# Patient Record
Sex: Female | Born: 1985 | Hispanic: Yes | Marital: Married | Smoking: Current some day smoker
Health system: Southern US, Community
[De-identification: ages and names within clinical notes are randomized; demographics above are authoritative.]

## PROBLEM LIST (undated history)

## (undated) DIAGNOSIS — J45909 Unspecified asthma, uncomplicated: Secondary | ICD-10-CM

## (undated) DIAGNOSIS — D649 Anemia, unspecified: Secondary | ICD-10-CM

## (undated) HISTORY — DX: Anemia, unspecified: D64.9

## (undated) HISTORY — PX: NO PAST SURGERIES: SHX2092

---
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
## 2007-08-13 ENCOUNTER — Observation Stay: Payer: Self-pay

## 2007-08-22 ENCOUNTER — Inpatient Hospital Stay: Payer: Self-pay | Admitting: Obstetrics and Gynecology

## 2010-08-28 ENCOUNTER — Ambulatory Visit: Payer: Self-pay | Admitting: Family Medicine

## 2011-01-21 ENCOUNTER — Ambulatory Visit: Payer: Self-pay | Admitting: Family Medicine

## 2011-02-08 ENCOUNTER — Observation Stay: Payer: Self-pay | Admitting: Obstetrics and Gynecology

## 2011-02-08 ENCOUNTER — Inpatient Hospital Stay: Payer: Self-pay | Admitting: Obstetrics and Gynecology

## 2011-02-08 LAB — CBC WITH DIFFERENTIAL/PLATELET
Basophil #: 0 10*3/uL (ref 0.0–0.1)
Eosinophil #: 0.1 10*3/uL (ref 0.0–0.7)
HGB: 13.1 g/dL (ref 12.0–16.0)
Lymphocyte #: 1.4 10*3/uL (ref 1.0–3.6)
Lymphocyte %: 17.4 %
MCH: 35.7 pg — ABNORMAL HIGH (ref 26.0–34.0)
MCV: 103 fL — ABNORMAL HIGH (ref 80–100)
Monocyte #: 0.7 10*3/uL (ref 0.0–0.7)
Monocyte %: 9 %
Neutrophil #: 5.9 10*3/uL (ref 1.4–6.5)
Neutrophil %: 72.5 %
Platelet: 129 10*3/uL — ABNORMAL LOW (ref 150–440)
RBC: 3.66 10*6/uL — ABNORMAL LOW (ref 3.80–5.20)
WBC: 8.1 10*3/uL (ref 3.6–11.0)

## 2011-02-10 LAB — HEMOGLOBIN: HGB: 12.2 g/dL (ref 12.0–16.0)

## 2011-08-28 ENCOUNTER — Emergency Department: Payer: Self-pay | Admitting: Emergency Medicine

## 2011-08-28 LAB — HCG, QUANTITATIVE, PREGNANCY: Beta Hcg, Quant.: <1 m[IU]/mL — ABNORMAL LOW

## 2014-07-05 ENCOUNTER — Encounter: Payer: Self-pay | Admitting: Emergency Medicine

## 2014-07-05 ENCOUNTER — Emergency Department
Admission: EM | Admit: 2014-07-05 | Discharge: 2014-07-06 | Disposition: A | Discharge status: Home / Self Care | Payer: Self-pay | Attending: Emergency Medicine | Admitting: Emergency Medicine

## 2014-07-05 DIAGNOSIS — N12 Tubulo-interstitial nephritis, not specified as acute or chronic: Secondary | ICD-10-CM | POA: Insufficient documentation

## 2014-07-05 DIAGNOSIS — Z3202 Encounter for pregnancy test, result negative: Secondary | ICD-10-CM | POA: Insufficient documentation

## 2014-07-05 LAB — COMPREHENSIVE METABOLIC PANEL
ALBUMIN: 3.6 g/dL (ref 3.5–5.0)
ALK PHOS: 53 U/L (ref 38–126)
ALT: 15 U/L (ref 14–54)
ANION GAP: 9 (ref 5–15)
AST: 17 U/L (ref 15–41)
BUN: 9 mg/dL (ref 6–20)
CHLORIDE: 106 mmol/L (ref 101–111)
CO2: 22 mmol/L (ref 22–32)
Calcium: 9 mg/dL (ref 8.9–10.3)
Creatinine, Ser: 0.9 mg/dL (ref 0.44–1.00)
GFR calc Af Amer: >60 mL/min (ref >60)
GFR calc non Af Amer: >60 mL/min (ref >60)
Glucose, Bld: 106 mg/dL — ABNORMAL HIGH (ref 65–99)
Potassium: 3.7 mmol/L (ref 3.5–5.1)
SODIUM: 137 mmol/L (ref 135–145)
Total Bilirubin: 0.7 mg/dL (ref 0.3–1.2)
Total Protein: 7.1 g/dL (ref 6.5–8.1)

## 2014-07-05 MED ORDER — ACETAMINOPHEN 325 MG PO TABS
650.0000 mg | ORAL_TABLET | Freq: Once | ORAL | Status: AC
  Administered 2014-07-05: 650 mg via ORAL

## 2014-07-05 MED ORDER — SODIUM CHLORIDE 0.9 % IV BOLUS (SEPSIS)
1000.0000 mL | Freq: Once | INTRAVENOUS | Status: AC
Start: 2014-07-05 — End: 2014-07-06
  Administered 2014-07-05: 1000 mL via INTRAVENOUS

## 2014-07-05 MED ORDER — ACETAMINOPHEN 325 MG PO TABS
ORAL_TABLET | ORAL | Status: AC
  Filled 2014-07-05: qty 2

## 2014-07-05 NOTE — ED Notes (Signed)
Pt presents to the ER from home with complaints of urinary frequency, burning sensation and hematuria since Friday. Pt reports lower back pain this afternoon, severe pain.

## 2014-07-06 LAB — PREGNANCY, URINE: PREG TEST UR: NEGATIVE

## 2014-07-06 LAB — CBC WITH DIFFERENTIAL/PLATELET
Basophils Absolute: 0 10*3/uL (ref 0–0.1)
Basophils Relative: 0 %
EOS ABS: 0 10*3/uL (ref 0–0.7)
EOS PCT: 1 %
HCT: 34.1 % — ABNORMAL LOW (ref 35.0–47.0)
HEMOGLOBIN: 11.8 g/dL — AB (ref 12.0–16.0)
Lymphocytes Relative: 5 %
Lymphs Abs: 0.4 10*3/uL — ABNORMAL LOW (ref 1.0–3.6)
MCH: 32.8 pg (ref 26.0–34.0)
MCHC: 34.5 g/dL (ref 32.0–36.0)
MCV: 95.2 fL (ref 80.0–100.0)
Monocytes Absolute: 0.2 10*3/uL (ref 0.2–0.9)
Monocytes Relative: 2 %
NEUTROS ABS: 8.5 10*3/uL — AB (ref 1.4–6.5)
NEUTROS PCT: 92 %
Platelets: 137 10*3/uL — ABNORMAL LOW (ref 150–440)
RBC: 3.58 MIL/uL — AB (ref 3.80–5.20)
RDW: 12.8 % (ref 11.5–14.5)
WBC: 9.2 10*3/uL (ref 3.6–11.0)

## 2014-07-06 LAB — URINALYSIS COMPLETE WITH MICROSCOPIC (ARMC ONLY)
Bacteria, UA: NONE SEEN
Bilirubin Urine: NEGATIVE
Glucose, UA: NEGATIVE mg/dL
KETONES UR: NEGATIVE mg/dL
NITRITE: NEGATIVE
PH: 6 (ref 5.0–8.0)
Protein, ur: 100 mg/dL — AB
SPECIFIC GRAVITY, URINE: 1.011 (ref 1.005–1.030)

## 2014-07-06 MED ORDER — KETOROLAC TROMETHAMINE 30 MG/ML IJ SOLN
30.0000 mg | Freq: Once | INTRAMUSCULAR | Status: AC
  Administered 2014-07-06: 30 mg via INTRAVENOUS

## 2014-07-06 MED ORDER — CEFTRIAXONE SODIUM IN DEXTROSE 20 MG/ML IV SOLN
1.0000 g | Freq: Once | INTRAVENOUS | Status: AC
  Administered 2014-07-06: 1 g via INTRAVENOUS

## 2014-07-06 MED ORDER — SULFAMETHOXAZOLE-TRIMETHOPRIM 800-160 MG PO TABS: 1.0000 | ORAL_TABLET | Freq: Two times a day (BID) | ORAL | Status: DC

## 2014-07-06 MED ORDER — SODIUM CHLORIDE 0.9 % IV BOLUS (SEPSIS)
1000.0000 mL | Freq: Once | INTRAVENOUS | Status: AC
  Administered 2014-07-06: 1000 mL via INTRAVENOUS

## 2014-07-06 MED ORDER — KETOROLAC TROMETHAMINE 30 MG/ML IJ SOLN
INTRAMUSCULAR | Status: AC
  Filled 2014-07-06: qty 8

## 2014-07-06 MED ORDER — CEFTRIAXONE SODIUM IN DEXTROSE 20 MG/ML IV SOLN
INTRAVENOUS | Status: AC
  Administered 2014-07-06: 1 g via INTRAVENOUS
  Filled 2014-07-06: qty 50

## 2014-07-06 NOTE — ED Provider Notes (Signed)
[facility] Emergency Department Provider Note  ____________________________________________  Time seen: Approximately 12:15 AM  I have reviewed the triage vital signs and the nursing notes.   HISTORY  Chief Complaint Dysuria; Hematuria; Urinary Frequency; and Back Pain    HPI Leah Villarreal is a 29 y.o. female who comes in with some abdominal pain and back pain. She reports that the symptoms started 5 days ago. She reports that she developed some pain with urination as well as pain in her abdomen and back. She reports that the pain is all across her abdomen as well as in her back on both sides with the right greater than left. She reports that she developed some fevers today with nausea but no vomiting. She also noticed some blood in her urine as well. The patient was concerned so she came in for further evaluation. She reports that her pain is 8 out of 10 in intensity in her back and he feels that was stabbing. The patient did not take anything at home for pain.   History reviewed. No pertinent past medical history.  There are no active problems to display for this patient.   History reviewed. No pertinent past surgical history.  Current Outpatient Rx  Name  Route  Sig  Dispense  Refill  . sulfamethoxazole-trimethoprim (BACTRIM DS,SEPTRA DS) 800-160 MG per tablet   Oral   Take 1 tablet by mouth 2 (two) times daily.   14 tablet   0     Allergies Review of patient's allergies indicates no known allergies.  No family history on file.  Social History History  Substance Use Topics  . Smoking status: Never Smoker   . Smokeless tobacco: Not on file  . Alcohol Use: No    Review of Systems Constitutional: Fever Eyes: Alert vision ENT: No sore throat. Cardiovascular: Denies chest pain. Respiratory: Denies shortness of breath. Gastrointestinal: Abdominal pain, nausea, bilateral flank pain Genitourinary: Dysuria and  hematuria Musculoskeletal: Bilateral flank pain Skin: Negative for rash. Neurological: Negative for headaches,   10-point ROS otherwise negative.  ____________________________________________   PHYSICAL EXAM:  VITAL SIGNS: ED Triage Vitals  Enc Vitals Group     BP 07/05/14 2255 112/53 mmHg     Pulse Rate 07/05/14 2255 127     Resp 07/05/14 2255 24     Temp 07/05/14 2255 102.1 F (38.9 C)     Temp Source 07/05/14 2255 Oral     SpO2 07/05/14 2255 100 %     Weight 07/05/14 2255 145 lb (65.772 kg)     Height 07/05/14 2255 5\' 2"  (1.575 m)     Head Cir --      Peak Flow --      Pain Score 07/05/14 2256 10     Pain Loc --      Pain Edu? --      Excl. in GC? --     Constitutional: Alert and oriented. Well appearing and in moderate distress. Eyes: Conjunctivae are normal. PERRL. EOMI. Head: Atraumatic. Nose: No congestion/rhinnorhea. Mouth/Throat: Mucous membranes are moist.  Oropharynx non-erythematous. Cardiovascular: Tachycardia with regular rhythm. Grossly normal heart sounds.  Good peripheral circulation. Respiratory: Normal respiratory effort.  No retractions. Lungs CTAB. Gastrointestinal: Diffusely tender to palpation, right sided CVA tenderness, positive bowel sounds, soft Genitourinary: Deferred Musculoskeletal: No lower extremity tenderness nor edema.  Neurologic:  Normal speech and language. No gross focal neurologic deficits are appreciated.  Skin:  Skin is warm, dry and intact. No rash noted. Psychiatric:  Mood and affect are normal. .  ____________________________________________   LABS (all labs ordered are listed, but only abnormal results are displayed)  Labs Reviewed  CBC WITH DIFFERENTIAL/PLATELET - Abnormal; Notable for the following:    RBC 3.58 (*)    Hemoglobin 11.8 (*)    HCT 34.1 (*)    Platelets 137 (*)    Neutro Abs 8.5 (*)    Lymphs Abs 0.4 (*)    All other components within normal limits  COMPREHENSIVE METABOLIC PANEL - Abnormal; Notable  for the following:    Glucose, Bld 106 (*)    All other components within normal limits  URINALYSIS COMPLETEWITH MICROSCOPIC ([facility] ONLY) - Abnormal; Notable for the following:    Color, Urine YELLOW (*)    APPearance HAZY (*)    Hgb urine dipstick 3+ (*)    Protein, ur 100 (*)    Leukocytes, UA 2+ (*)    Squamous Epithelial / LPF 0-5 (*)    All other components within normal limits  CULTURE, BLOOD (ROUTINE X 2)  CULTURE, BLOOD (ROUTINE X 2)  PREGNANCY, URINE   ____________________________________________  EKG  None ____________________________________________  RADIOLOGY  None ____________________________________________   PROCEDURES  Procedure(s) performed: None  Critical Care performed: No  ____________________________________________   INITIAL IMPRESSION / ASSESSMENT AND PLAN / ED COURSE  Pertinent labs & imaging results that were available during my care of the patient were reviewed by me and considered in my medical decision making (see chart for details).  This is a 29 year old female who comes in today with some abdominal pain and flank pain with a temperature with a concern for urinary tract infection versus pyelonephritis. The patient did receive some normal saline as well as Tylenol for her temperature of 102.1. I will give the patient some antibiotics and some more fluid in an attempt to improve her tachycardia.  ----------------------------------------- 3:12 AM on 07/06/2014 -----------------------------------------  Gross second liter of normal saline and a dose of ceftriaxone the patient's tachycardia improved. The patient's fever also did improve. I gave her dose of Toradol for the pain and she does feel improved. I will discharge the patient home to follow-up with her primary care physician. The patient should return should she develop any vomiting or any worsening of her symptoms. ____________________________________________   FINAL CLINICAL  IMPRESSION(S) / ED DIAGNOSES  Final diagnoses:  Pyelonephritis      Rebecka Apley, MD 07/06/14 [phone]

## 2014-07-06 NOTE — Discharge Instructions (Signed)
Pielonefritis - Adultos  °(Pyelonephritis, Adult) ° La pielonefritis es una infección del riñón. Hay dos tipos principales de pielonefritis:  °· Una infección que se inicia rápidamente sin síntomas previos (pielonefritis aguda). °· Infecciones que persisten por un largo período (pielonefritis crónica). °CAUSAS  °Hay dos causas principales:  °· Pasaje de bacterias desde la vejiga al riñón. Este problema aparece especialmente en mujeres embarazadas. La orina en la vejiga puede infectarse por diferentes causas, por ejemplo: °¨ Inflamación de la próstata (prostatitis). °¨ Durante las relaciones sexuales en las mujeres. °¨ Infección en la vejiga (cistitis). °· Pasaje de bacterias desde la sangre hacia el riñón. °Las enfermedades que aumentan el riesgo son:  °· Diabetes. °· Cálculos renales o en la vesícula. °· Cáncer. °· Un catéter colocado en la vejiga. °· Otras anormalidades del riñón o de la uretra. °SÍNTOMAS  °· Dolor abdominal °· Dolor en la zona del costado o flanco. °· Fiebre. °· Escalofríos. °· Malestar estomacal. °· Sangre en la orina (orina oscura). °· Necesidad frecuente de orinar °· Necesidad intensa o persistente de orinar. °· Sensación de ardor o pinchazos al orinar. °DIAGNÓSTICO  °El médico diagnosticará una infección en su riñón basándose en los síntomas. También tomará una muestra de orina.  °TRATAMIENTO  °Generalmente el tratamiento depende de la gravedad de la infección.  °· Si la infección es leve y se diagnostica a tiempo, el médico lo tratará con antibióticos por vía oral y lo dejará irse a su casa. °· Si la infección es más grave, la bacteria podría haber ingresado al torrente sanguíneo. Esto requerirá antibióticos por vía intravenosa y la permanencia en el hospital. Los síntomas pueden incluir: °¨ Fiebre alta. °¨ Dolor intenso en un costado del cuerpo. °¨ Escalofríos °· Aún después de haber permanecido en el hospital, el médico podrá indicarle antibióticos por vía oral durante cierto período de  tiempo. °· Podrá prescribirle otros tratamientos según la causa de la infección. °INSTRUCCIONES PARA EL CUIDADO EN EL HOGAR  °· Tome los antibióticos como se le indicó. Tómelos todos, aunque se sienta mejor. °· Concurra para realizar un control y asegurarse de que la infección ha desaparecido. °· Debe ingerir gran cantidad de líquido para mantener la orina de tono claro o color amarillo pálido. °· Tome medicamentos para la vejiga si siente urgencia para orinar o lo hace con mucha frecuencia. °SOLICITE ATENCIÓN MÉDICA DE INMEDIATO SI:  °· Tiene fiebre o síntomas persistentes durante más de 2 ó 3 días. °· Tiene fiebre y los síntomas empeoran. °· No puede tomar los antibióticos ni ingerir líquidos. °· Comienza a sentir escalofríos. °· Siente debilidad extrema o se desmaya. °· No mejora después de 2 días de tratamiento. °ASEGÚRESE DE QUE:  °· Comprende estas instrucciones. °· Controlará su enfermedad. °· Solicitará ayuda de inmediato si no mejora o empeora. °Document Released: 10/31/2004 Document Revised: 07/23/2011 °ExitCare® Patient Information ©2015 [employer]. This information is not intended to replace advice given to you by your health care provider. Make sure you discuss any questions you have with your health care provider. ° °

## 2014-07-08 LAB — CULTURE, BLOOD (ROUTINE X 2)

## 2014-07-11 ENCOUNTER — Telehealth: Payer: Self-pay | Admitting: Emergency Medicine

## 2014-07-11 NOTE — ED Notes (Signed)
Called pt at request of dr Fanny Bienquale to ask Leah Villarreal to return to the ED for re evaluation due to positive blood culture. [name]  ([facility] interpretter) interpretted.  I asked pateitn to return and explained positive blood culture.   Leah Villarreal says she feels fine now. She does not have pcp.  [facility]  She said she would think about it.  i told her to definitely return to the er if she gets feeling worse or fever or any worsening condition and she agrees.

## 2014-07-11 NOTE — ED Provider Notes (Signed)
I was notified by pharmacy, patient had a positive blood culture for Escherichia coli resistant to Bactrim.  Based on the patient's symptoms of pyelonephritis with elevated temperature and heart rate, my primary concern is the patient may be septic and was discharged on antibiotic which is Escherichia coli is resistant to. Our infectious disease expert does recommend Bactrim, but appears in this case that this particular bacteria may be resistant.  I have given report to Sebastian AcheGreg Moyer, charge nurse, and he will assure that the patient is followed up with for contact and to have him return to the ER for repeat evaluation and exam. At this point, I'm hesitant to prescribe him any additional antibiotic prior to him returning for reevaluation to assure that he is clinically stable for any sort of outpatient treatment.  Charge nurse will follow-through to see that patient is contacted and returns the ER.  Sharyn CreamerMark Lawyer Washabaugh, MD 07/11/14 [phone]

## 2014-07-21 ENCOUNTER — Encounter: Payer: Self-pay | Admitting: Emergency Medicine

## 2014-07-21 ENCOUNTER — Emergency Department
Admission: EM | Admit: 2014-07-21 | Discharge: 2014-07-21 | Disposition: A | Discharge status: Home / Self Care | Payer: Self-pay | Attending: Emergency Medicine | Admitting: Emergency Medicine

## 2014-07-21 DIAGNOSIS — Z3202 Encounter for pregnancy test, result negative: Secondary | ICD-10-CM | POA: Insufficient documentation

## 2014-07-21 DIAGNOSIS — N390 Urinary tract infection, site not specified: Secondary | ICD-10-CM | POA: Insufficient documentation

## 2014-07-21 LAB — URINALYSIS COMPLETE WITH MICROSCOPIC (ARMC ONLY)
BACTERIA UA: NONE SEEN
Bilirubin Urine: NEGATIVE
GLUCOSE, UA: NEGATIVE mg/dL
Hgb urine dipstick: NEGATIVE
KETONES UR: NEGATIVE mg/dL
NITRITE: NEGATIVE
Protein, ur: NEGATIVE mg/dL
Specific Gravity, Urine: 1.015 (ref 1.005–1.030)
pH: 5 (ref 5.0–8.0)

## 2014-07-21 LAB — POCT PREGNANCY, URINE: PREG TEST UR: NEGATIVE

## 2014-07-21 MED ORDER — CEPHALEXIN 500 MG PO CAPS: 500.0000 mg | ORAL_CAPSULE | Freq: Four times a day (QID) | ORAL | Status: AC

## 2014-07-21 NOTE — ED Provider Notes (Signed)
CSN: [id number]     Arrival date & time 07/21/14  1249 History   First MD Initiated Contact with Patient 07/21/14 1458     Chief Complaint  Patient presents with  . Hematuria     (Consider location/radiation/quality/duration/timing/severity/associated sxs/prior Treatment) HPI Comments: 29 year old female presents today complaining of dysuria for the past few days. Pt has not had any fevers, chills, back or abdominal pain. No nausea or vomiting. She is concerned because she was evaluated here on 07/06/14 and then called back on 07/11/14 and informed she had a positive blood culture. She was advised to return to the ER or see her primary doctor. [facility]-and they told her they couldn't help her. She has felt fine since being discharged aside from dysuria that re-developed 2 days ago.   Patient is a 29 y.o. female presenting with dysuria. The history is provided by the patient. A language interpreter was used.  Dysuria Pain quality:  Burning Pain severity:  Mild Onset quality:  Gradual Duration:  2 days Timing:  Constant Progression:  Unchanged Chronicity:  Recurrent Recent urinary tract infections: yes   Relieved by:  None tried Worsened by:  Nothing tried Ineffective treatments:  None tried Urinary symptoms: frequent urination   Associated symptoms: no abdominal pain, no fever, no flank pain, no nausea and no vomiting   Risk factors: hx of pyelonephritis and recurrent urinary tract infections   Risk factors: not pregnant and not sexually active     History reviewed. No pertinent past medical history. History reviewed. No pertinent past surgical history. History reviewed. No pertinent family history. History  Substance Use Topics  . Smoking status: Never Smoker   . Smokeless tobacco: Not on file  . Alcohol Use: No   OB History    No data available     Review of Systems  Constitutional: Negative for fever and chills.  Gastrointestinal: Negative  for nausea, vomiting and abdominal pain.  Genitourinary: Positive for dysuria. Negative for flank pain.  All other systems reviewed and are negative.     Allergies  Review of patient's allergies indicates no known allergies.  Home Medications   Prior to Admission medications   Medication Sig Start Date End Date Taking? Authorizing Provider  cephALEXin (KEFLEX) 500 MG capsule Take 1 capsule (500 mg total) by mouth 4 (four) times daily. 07/21/14 07/31/14  Luvenia Redden, PA-C  sulfamethoxazole-trimethoprim (BACTRIM DS,SEPTRA DS) 800-160 MG per tablet Take 1 tablet by mouth 2 (two) times daily. 07/06/14   Rebecka Apley, MD   BP 115/69 mmHg  Pulse 73  Temp(Src) 98.1 F (36.7 C) (Oral)  Resp 16  Ht  (1.676 m)  Wt 150 lb (68.04 kg)  BMI 24.22 kg/m2  SpO2 100%  LMP  (LMP Unknown) Physical Exam  Constitutional: She is oriented to person, place, and time. Vital signs are normal. She appears well-developed and well-nourished.  Non-toxic appearance. She does not have a sickly appearance. She does not appear ill.  HENT:  Head: Normocephalic and atraumatic.  Neck: Normal range of motion. Neck supple.  Cardiovascular: Normal rate, regular rhythm and intact distal pulses.  Exam reveals no gallop and no friction rub.   No murmur heard. Pulmonary/Chest: Effort normal and breath sounds normal.  Abdominal: Soft. Bowel sounds are normal. She exhibits no distension. There is no tenderness. There is no rebound.  No CVA tenderness  Musculoskeletal: Normal range of motion.  Neurological: She is alert and  oriented to person, place, and time.  Skin: Skin is warm and dry.  Psychiatric: She has a normal mood and affect. Her behavior is normal. Judgment and thought content normal.  Nursing note and vitals reviewed.   ED Course  Procedures (including critical care time) Labs Review Labs Reviewed  URINALYSIS COMPLETEWITH MICROSCOPIC ([facility] ONLY) - Abnormal; Notable for the following:    Color,  Urine YELLOW (*)    APPearance CLEAR (*)    Leukocytes, UA 1+ (*)    Squamous Epithelial / LPF 6-30 (*)    All other components within normal limits  POCT PREGNANCY, URINE  negative   Imaging Review No results found.   EKG Interpretation None      Labs as above were reviewed. Pt only has 1+ leukocytes with multiple squamous cells. However, she had similar findings 2 weeks ago when she had pyelonephritis.   MDM  I will write pt for keflex, her prior urine culture was sensitive to keflex. Written for 10 days course. Explained that since she was feeling better her blood had likely cleared the infection. Return to ER for new or worsening symptoms  Final diagnoses:  UTI (lower urinary tract infection)        Wilber Oliphant V, PA-C 07/21/14 1952  Jene Every, MD 07/22/14 [phone]

## 2014-07-21 NOTE — ED Notes (Signed)
Pt reports lower back pain and burning with urination

## 2014-07-21 NOTE — ED Notes (Signed)
Pt reports seen 2 weeks ago, d/c home and then called and told to come back. Pt reports unsure why. Pt spanish speaking, interpreter in room with physician. Pt reports tried to f/u with her PMD but they couldn't help. Pt denies sx's currently other than dysuria.

## 2016-07-09 ENCOUNTER — Other Ambulatory Visit: Payer: Self-pay | Admitting: Advanced Practice Midwife

## 2016-07-09 DIAGNOSIS — Z3481 Encounter for supervision of other normal pregnancy, first trimester: Secondary | ICD-10-CM

## 2016-07-09 LAB — HM PAP SMEAR: HM Pap smear: NEGATIVE

## 2016-07-09 LAB — OB RESULTS CONSOLE HIV ANTIBODY (ROUTINE TESTING): HIV: NONREACTIVE

## 2016-07-10 LAB — OB RESULTS CONSOLE ABO/RH: RH Type: POSITIVE

## 2016-07-10 LAB — OB RESULTS CONSOLE ANTIBODY SCREEN: ANTIBODY SCREEN: NEGATIVE

## 2016-07-18 ENCOUNTER — Ambulatory Visit
Admission: RE | Admit: 2016-07-18 | Discharge: 2016-07-18 | Disposition: A | Discharge status: Home / Self Care | Payer: Self-pay | Source: Ambulatory Visit | Attending: Advanced Practice Midwife | Admitting: Advanced Practice Midwife

## 2016-07-18 DIAGNOSIS — Z3481 Encounter for supervision of other normal pregnancy, first trimester: Secondary | ICD-10-CM | POA: Insufficient documentation

## 2016-07-18 DIAGNOSIS — Z3A11 11 weeks gestation of pregnancy: Secondary | ICD-10-CM | POA: Insufficient documentation

## 2016-08-06 ENCOUNTER — Other Ambulatory Visit: Payer: Self-pay | Admitting: Advanced Practice Midwife

## 2016-08-06 DIAGNOSIS — Z3482 Encounter for supervision of other normal pregnancy, second trimester: Secondary | ICD-10-CM

## 2016-09-03 ENCOUNTER — Ambulatory Visit
Admission: RE | Admit: 2016-09-03 | Discharge: 2016-09-03 | Disposition: A | Discharge status: Home / Self Care | Payer: Self-pay | Source: Ambulatory Visit | Attending: Advanced Practice Midwife | Admitting: Advanced Practice Midwife

## 2016-09-03 DIAGNOSIS — Z3482 Encounter for supervision of other normal pregnancy, second trimester: Secondary | ICD-10-CM | POA: Insufficient documentation

## 2016-10-11 DIAGNOSIS — D649 Anemia, unspecified: Secondary | ICD-10-CM | POA: Insufficient documentation

## 2016-10-11 DIAGNOSIS — O99013 Anemia complicating pregnancy, third trimester: Secondary | ICD-10-CM | POA: Insufficient documentation

## 2016-10-11 NOTE — Progress Notes (Signed)
[facility]  [phone] [phone]  ID: Fleet Contras OB: [birth year]  MR#: [id number]  AOZ#:[id number]  Patient Care Team: Nonda Lou, MD as PCP - General (Family Medicine)  CHIEF COMPLAINT: Anemia affecting pregnancy in the third trimester.  INTERVAL HISTORY: Patient is a 31 year old female in her third trimester who was noted to have a declining hemoglobin and platelet count on routine blood work. She currently feels well and is asymptomatic. She has no neurologic complaints. She does not complain of any weakness or fatigue. She has a good appetite and is gaining weight appropriately. She has no chest pain or shortness of breath. She denies any nausea, vomiting, constipation, or diarrhea. She has no urinary complaints. Patient feels at her baseline and offers no specific complaints today.  REVIEW OF SYSTEMS:   Review of Systems  Constitutional: Negative.  Negative for fever, malaise/fatigue and weight loss.  Respiratory: Negative.  Negative for cough and shortness of breath.   Cardiovascular: Negative.  Negative for chest pain and leg swelling.  Gastrointestinal: Negative.  Negative for abdominal pain, blood in stool and melena.  Genitourinary: Negative.   Musculoskeletal: Negative.   Skin: Negative.  Negative for rash.  Neurological: Negative.  Negative for sensory change and weakness.  Psychiatric/Behavioral: Negative.  The patient is not nervous/anxious.     As per HPI. Otherwise, a complete review of systems is negative.  PAST MEDICAL HISTORY: No past medical history on file.  PAST SURGICAL HISTORY: No past surgical history on file.  FAMILY HISTORY: Reviewed and unchanged. No reported history of malignancy or chronic disease.   ADVANCED DIRECTIVES (Y/N):  N  HEALTH MAINTENANCE: Social History  Substance Use Topics  . Smoking status: Never Smoker  . Smokeless tobacco: Not on file  . Alcohol use No      Colonoscopy:  PAP:  Bone density:  Lipid panel:  No Known Allergies  Current Outpatient Prescriptions  Medication Sig Dispense Refill  . Prenatal Vit-Fe Fumarate-FA (PRENATAL VITAMIN PO) Take by mouth.     No current facility-administered medications for this visit.     OBJECTIVE: Vitals:   10/14/16 1535  BP: 103/64  Pulse: 81  Resp: 20  Temp: (!) 96.8 F (36 C)     Body mass index is 22.98 kg/m.    ECOG FS:0 - Asymptomatic  General: Well-developed, well-nourished, no acute distress. Eyes: Pink conjunctiva, anicteric sclera. HEENT: Normocephalic, moist mucous membranes, clear oropharnyx. Lungs: Clear to auscultation bilaterally. Heart: Regular rate and rhythm. No rubs, murmurs, or gallops. Abdomen: Appears appropriate for gestational age. Musculoskeletal: No edema, cyanosis, or clubbing. Neuro: Alert, answering all questions appropriately. Cranial nerves grossly intact. Skin: No rashes or petechiae noted. Psych: Normal affect.   LAB RESULTS:  Lab Results  Component Value Date   NA 137 07/05/2014   K 3.7 07/05/2014   CL 106 07/05/2014   CO2 22 07/05/2014   GLUCOSE 106 (H) 07/05/2014   BUN 9 07/05/2014   CREATININE 0.90 07/05/2014   CALCIUM 9.0 07/05/2014   PROT 7.1 07/05/2014   ALBUMIN 3.6 07/05/2014   AST 17 07/05/2014   ALT 15 07/05/2014   ALKPHOS 53 07/05/2014   BILITOT 0.7 07/05/2014   GFRNONAA >60 07/05/2014   GFRAA >60 07/05/2014    Lab Results  Component Value Date   WBC 7.3 10/14/2016   NEUTROABS 8.5 (H) 07/05/2014   HGB 12.4 10/14/2016   HCT 34.2 (L) 10/14/2016   MCV 99.4 10/14/2016   PLT 138 (L) 10/14/2016  STUDIES: No results found.  ASSESSMENT: Anemia affecting pregnancy in the third trimester.  PLAN:    1. Anemia affecting pregnancy in the third trimester: Patient's hemoglobin and iron stores are currently within normal limits. Previously B-12 and folate levels were reported within normal limits. She has no evidence of  hemolysis. No intervention is needed at this time. Patient will return to clinic in early December several weeks prior to her due date for repeat laboratory work and further evaluation. 2. Thrombocytopenia: Mild, no intervention needed. Monitor. Follow-up as above. 3. Pregnancy: Patient's estimated due date is February 04, 2017.  Patient expressed understanding and was in agreement with this plan. She also understands that She can call clinic at any time with any questions, concerns, or complaints.    Jeralyn Ruthsimothy J Finnegan, MD   10/18/2016 9:49 AM

## 2016-10-14 ENCOUNTER — Inpatient Hospital Stay: Payer: Self-pay

## 2016-10-14 ENCOUNTER — Encounter (INDEPENDENT_AMBULATORY_CARE_PROVIDER_SITE_OTHER): Payer: Self-pay

## 2016-10-14 ENCOUNTER — Inpatient Hospital Stay: Payer: Self-pay | Attending: Oncology | Admitting: Oncology

## 2016-10-14 DIAGNOSIS — D696 Thrombocytopenia, unspecified: Secondary | ICD-10-CM | POA: Insufficient documentation

## 2016-10-14 DIAGNOSIS — O99013 Anemia complicating pregnancy, third trimester: Secondary | ICD-10-CM

## 2016-10-14 DIAGNOSIS — Z331 Pregnant state, incidental: Secondary | ICD-10-CM | POA: Insufficient documentation

## 2016-10-14 DIAGNOSIS — D649 Anemia, unspecified: Secondary | ICD-10-CM | POA: Insufficient documentation

## 2016-10-14 LAB — CBC
HCT: 34.2 % — ABNORMAL LOW (ref 35.0–47.0)
Hemoglobin: 12.4 g/dL (ref 12.0–16.0)
MCH: 35.9 pg — ABNORMAL HIGH (ref 26.0–34.0)
MCHC: 36.1 g/dL — ABNORMAL HIGH (ref 32.0–36.0)
MCV: 99.4 fL (ref 80.0–100.0)
PLATELETS: 138 10*3/uL — AB (ref 150–440)
RBC: 3.44 MIL/uL — ABNORMAL LOW (ref 3.80–5.20)
RDW: 13.7 % (ref 11.5–14.5)
WBC: 7.3 10*3/uL (ref 3.6–11.0)

## 2016-10-14 LAB — IRON AND TIBC
Iron: 128 ug/dL (ref 28–170)
Saturation Ratios: 39 % — ABNORMAL HIGH (ref 10.4–31.8)
TIBC: 326 ug/dL (ref 250–450)
UIBC: 198 ug/dL

## 2016-10-14 LAB — DAT, POLYSPECIFIC AHG (ARMC ONLY): Polyspecific AHG test: NEGATIVE

## 2016-10-14 LAB — LACTATE DEHYDROGENASE: LDH: 101 U/L (ref 98–192)

## 2016-10-14 LAB — FERRITIN: FERRITIN: 18 ng/mL (ref 11–307)

## 2016-10-14 NOTE — Progress Notes (Signed)
Patient here today for initial evaluation regarding anemia in pregnancy. Patient denies any concerns today.

## 2016-10-15 LAB — HAPTOGLOBIN: Haptoglobin: 65 mg/dL (ref 34–200)

## 2016-11-13 LAB — HM HIV SCREENING LAB: HM HIV Screening: NEGATIVE

## 2016-11-13 LAB — OB RESULTS CONSOLE HEPATITIS B SURFACE ANTIGEN: HEP B S AG: NEGATIVE

## 2016-11-14 LAB — OB RESULTS CONSOLE RPR: RPR: NONREACTIVE

## 2017-01-01 ENCOUNTER — Other Ambulatory Visit: Payer: Self-pay | Admitting: Oncology

## 2017-01-05 NOTE — Progress Notes (Signed)
[facility]  Telephone:(336) [phone] Fax:(336) [phone]  ID: Leah Villarreal OB: 1985/10/30  MR#: [id number]  AOZ#:[id number]#:[id number]  Patient Care Team: Nonda LouShapely-Quinn, Todd [place], MD as PCP - General (Family Medicine)  CHIEF COMPLAINT: Anemia affecting pregnancy in the third trimester.  INTERVAL HISTORY: Patient returns to clinic today for repeat laboratory work and further evaluation.  She is highly anxious, but otherwise feels well. She has no neurologic complaints. She does not complain of any weakness or fatigue. She has a good appetite and is gaining weight appropriately. She has no chest pain or shortness of breath. She denies any nausea, vomiting, constipation, or diarrhea. She has no urinary complaints. Patient offers no specific complaints today.  REVIEW OF SYSTEMS:   Review of Systems  Constitutional: Negative.  Negative for fever, malaise/fatigue and weight loss.  Respiratory: Negative.  Negative for cough and shortness of breath.   Cardiovascular: Negative.  Negative for chest pain and leg swelling.  Gastrointestinal: Negative.  Negative for abdominal pain, blood in stool and melena.  Genitourinary: Negative.   Musculoskeletal: Negative.   Skin: Negative.  Negative for rash.  Neurological: Negative.  Negative for sensory change and weakness.  Psychiatric/Behavioral: Negative.  The patient is not nervous/anxious.     As per HPI. Otherwise, a complete review of systems is negative.  PAST MEDICAL HISTORY: Past Medical History:  Diagnosis Date  . Anemia     PAST SURGICAL HISTORY: History reviewed. No pertinent surgical history.  FAMILY HISTORY: Reviewed and unchanged. No reported history of malignancy or chronic disease.   ADVANCED DIRECTIVES (Y/N):  N  HEALTH MAINTENANCE: Social History   Tobacco Use  . Smoking status: Never Smoker  . Smokeless tobacco: Never Used  Substance Use Topics  . Alcohol use: No  . Drug use: No      Colonoscopy:  PAP:  Bone density:  Lipid panel:  No Known Allergies  Current Outpatient Medications  Medication Sig Dispense Refill  . Prenatal Vit-Fe Fumarate-FA (PRENATAL VITAMIN PO) Take by mouth.     No current facility-administered medications for this visit.     OBJECTIVE: Vitals:   01/06/17 1406  BP: 131/78  Pulse: 93     Body mass index is 26.28 kg/m.    ECOG FS:0 - Asymptomatic  General: Well-developed, well-nourished, no acute distress. Eyes: Pink conjunctiva, anicteric sclera. Lungs: Clear to auscultation bilaterally. Heart: Regular rate and rhythm. No rubs, murmurs, or gallops. Abdomen: Appears appropriate for gestational age. Musculoskeletal: No edema, cyanosis, or clubbing. Neuro: Alert, answering all questions appropriately. Cranial nerves grossly intact. Skin: No rashes or petechiae noted. Psych: Normal affect.   LAB RESULTS:  Lab Results  Component Value Date   NA 137 07/05/2014   K 3.7 07/05/2014   CL 106 07/05/2014   CO2 22 07/05/2014   GLUCOSE 106 (H) 07/05/2014   BUN 9 07/05/2014   CREATININE 0.90 07/05/2014   CALCIUM 9.0 07/05/2014   PROT 7.1 07/05/2014   ALBUMIN 3.6 07/05/2014   AST 17 07/05/2014   ALT 15 07/05/2014   ALKPHOS 53 07/05/2014   BILITOT 0.7 07/05/2014   GFRNONAA >60 07/05/2014   GFRAA >60 07/05/2014    Lab Results  Component Value Date   WBC 7.1 01/06/2017   NEUTROABS 5.3 01/06/2017   HGB 13.1 01/06/2017   HCT 37.6 01/06/2017   MCV 102.9 (H) 01/06/2017   PLT 128 (L) 01/06/2017     STUDIES: No results found.  ASSESSMENT: Anemia affecting pregnancy in the third trimester.  PLAN:  1. Anemia affecting pregnancy in the third trimester: Patient's hemoglobin and iron stores continue to be within normal limits. Previously B-12 and folate levels were reported within normal limits. She has no evidence of hemolysis. No intervention is needed at this time.  No further follow-up is necessary.  Please refer  patient back if there are any questions or concerns.  2. Thrombocytopenia: Mild, no intervention needed. Monitor. No follow-up as above. 3. Pregnancy: Patient's estimated due date is February 04, 2017.  Patient expressed understanding and was in agreement with this plan. She also understands that She can call clinic at any time with any questions, concerns, or complaints.    Jeralyn Ruthsimothy J Staphanie Harbison, MD   01/06/2017 3:50 PM

## 2017-01-06 ENCOUNTER — Encounter: Payer: Self-pay | Admitting: Oncology

## 2017-01-06 ENCOUNTER — Inpatient Hospital Stay (HOSPITAL_BASED_OUTPATIENT_CLINIC_OR_DEPARTMENT_OTHER): Payer: Self-pay | Admitting: Oncology

## 2017-01-06 ENCOUNTER — Inpatient Hospital Stay: Payer: Self-pay | Attending: Oncology

## 2017-01-06 ENCOUNTER — Inpatient Hospital Stay: Payer: Self-pay

## 2017-01-06 ENCOUNTER — Other Ambulatory Visit: Payer: Self-pay

## 2017-01-06 VITALS — BP 131/78 | HR 93 | Wt 162.8 lb

## 2017-01-06 DIAGNOSIS — F419 Anxiety disorder, unspecified: Secondary | ICD-10-CM

## 2017-01-06 DIAGNOSIS — O99013 Anemia complicating pregnancy, third trimester: Secondary | ICD-10-CM

## 2017-01-06 DIAGNOSIS — D696 Thrombocytopenia, unspecified: Secondary | ICD-10-CM | POA: Insufficient documentation

## 2017-01-06 LAB — CBC WITH DIFFERENTIAL/PLATELET
Basophils Absolute: 0 10*3/uL (ref 0–0.1)
Basophils Relative: 0 %
EOS ABS: 0.1 10*3/uL (ref 0–0.7)
EOS PCT: 1 %
HCT: 37.6 % (ref 35.0–47.0)
HEMOGLOBIN: 13.1 g/dL (ref 12.0–16.0)
LYMPHS PCT: 16 %
Lymphs Abs: 1.1 10*3/uL (ref 1.0–3.6)
MCH: 35.9 pg — ABNORMAL HIGH (ref 26.0–34.0)
MCHC: 34.9 g/dL (ref 32.0–36.0)
MCV: 102.9 fL — ABNORMAL HIGH (ref 80.0–100.0)
Monocytes Absolute: 0.6 10*3/uL (ref 0.2–0.9)
Monocytes Relative: 8 %
NEUTROS PCT: 75 %
Neutro Abs: 5.3 10*3/uL (ref 1.4–6.5)
PLATELETS: 128 10*3/uL — AB (ref 150–440)
RBC: 3.66 MIL/uL — AB (ref 3.80–5.20)
RDW: 13.7 % (ref 11.5–14.5)
WBC: 7.1 10*3/uL (ref 3.6–11.0)

## 2017-01-06 LAB — IRON AND TIBC
IRON: 89 ug/dL (ref 28–170)
Saturation Ratios: 22 % (ref 10.4–31.8)
TIBC: 398 ug/dL (ref 250–450)
UIBC: 309 ug/dL

## 2017-01-06 LAB — FERRITIN: Ferritin: 13 ng/mL (ref 11–307)

## 2017-01-10 LAB — OB RESULTS CONSOLE GC/CHLAMYDIA
Chlamydia: NEGATIVE
GC PROBE AMP, GENITAL: NEGATIVE

## 2017-01-10 LAB — OB RESULTS CONSOLE GBS: GBS: NEGATIVE

## 2017-02-03 ENCOUNTER — Other Ambulatory Visit: Payer: Self-pay | Admitting: Obstetrics and Gynecology

## 2017-02-07 ENCOUNTER — Other Ambulatory Visit: Payer: Self-pay | Admitting: Obstetrics and Gynecology

## 2017-02-09 ENCOUNTER — Other Ambulatory Visit: Payer: Self-pay | Admitting: Certified Nurse Midwife

## 2017-02-10 ENCOUNTER — Inpatient Hospital Stay: Payer: Medicaid Other | Admitting: Anesthesiology

## 2017-02-10 ENCOUNTER — Other Ambulatory Visit: Payer: Self-pay

## 2017-02-10 ENCOUNTER — Inpatient Hospital Stay
Admission: EM | Admit: 2017-02-10 | Discharge: 2017-02-12 | DRG: 787 | Disposition: A | Discharge status: Home / Self Care | Payer: Medicaid Other | Attending: Obstetrics and Gynecology | Admitting: Obstetrics and Gynecology

## 2017-02-10 ENCOUNTER — Inpatient Hospital Stay: Payer: Medicaid Other

## 2017-02-10 ENCOUNTER — Encounter
Admission: EM | Disposition: A | Discharge status: Home / Self Care | Payer: Self-pay | Source: Home / Self Care | Attending: Obstetrics and Gynecology

## 2017-02-10 DIAGNOSIS — D6959 Other secondary thrombocytopenia: Secondary | ICD-10-CM | POA: Diagnosis present

## 2017-02-10 DIAGNOSIS — Z3A40 40 weeks gestation of pregnancy: Secondary | ICD-10-CM | POA: Diagnosis not present

## 2017-02-10 DIAGNOSIS — O329XX0 Maternal care for malpresentation of fetus, unspecified, not applicable or unspecified: Secondary | ICD-10-CM

## 2017-02-10 DIAGNOSIS — O9081 Anemia of the puerperium: Secondary | ICD-10-CM | POA: Diagnosis not present

## 2017-02-10 DIAGNOSIS — D62 Acute posthemorrhagic anemia: Secondary | ICD-10-CM | POA: Diagnosis not present

## 2017-02-10 DIAGNOSIS — O321XX0 Maternal care for breech presentation, not applicable or unspecified: Secondary | ICD-10-CM | POA: Diagnosis present

## 2017-02-10 DIAGNOSIS — O9912 Other diseases of the blood and blood-forming organs and certain disorders involving the immune mechanism complicating childbirth: Secondary | ICD-10-CM | POA: Diagnosis present

## 2017-02-10 DIAGNOSIS — O480 Post-term pregnancy: Secondary | ICD-10-CM | POA: Diagnosis present

## 2017-02-10 DIAGNOSIS — Z3A41 41 weeks gestation of pregnancy: Secondary | ICD-10-CM

## 2017-02-10 HISTORY — DX: Unspecified asthma, uncomplicated: J45.909

## 2017-02-10 LAB — URINALYSIS, ROUTINE W REFLEX MICROSCOPIC
Bilirubin Urine: NEGATIVE
Glucose, UA: NEGATIVE mg/dL
Ketones, ur: NEGATIVE mg/dL
NITRITE: NEGATIVE
PH: 6 (ref 5.0–8.0)
Protein, ur: NEGATIVE mg/dL
SPECIFIC GRAVITY, URINE: 1.005 (ref 1.005–1.030)

## 2017-02-10 LAB — CBC
HCT: 37.8 % (ref 35.0–47.0)
Hemoglobin: 13.5 g/dL (ref 12.0–16.0)
MCH: 36.6 pg — ABNORMAL HIGH (ref 26.0–34.0)
MCHC: 35.7 g/dL (ref 32.0–36.0)
MCV: 102.7 fL — ABNORMAL HIGH (ref 80.0–100.0)
PLATELETS: 123 10*3/uL — AB (ref 150–440)
RBC: 3.69 MIL/uL — AB (ref 3.80–5.20)
RDW: 13.1 % (ref 11.5–14.5)
WBC: 7.7 10*3/uL (ref 3.6–11.0)

## 2017-02-10 LAB — TYPE AND SCREEN
ABO/RH(D): O POS
Antibody Screen: NEGATIVE

## 2017-02-10 SURGERY — Surgical Case
Anesthesia: Spinal | Wound class: Clean Contaminated

## 2017-02-10 MED ORDER — OXYCODONE-ACETAMINOPHEN 5-325 MG PO TABS
1.0000 | ORAL_TABLET | ORAL | Status: DC | PRN
  Administered 2017-02-11 (×2): 1 via ORAL
  Filled 2017-02-10 (×2): qty 1

## 2017-02-10 MED ORDER — SODIUM CHLORIDE 0.9 % IV SOLN
INTRAVENOUS | Status: DC | PRN
  Administered 2017-02-10: 60 mL

## 2017-02-10 MED ORDER — TERBUTALINE SULFATE 1 MG/ML IJ SOLN: 0.2500 mg | Freq: Once | INTRAMUSCULAR | Status: DC | PRN

## 2017-02-10 MED ORDER — BISACODYL 10 MG RE SUPP
10.0000 mg | Freq: Every day | RECTAL | Status: DC | PRN
  Filled 2017-02-10: qty 1

## 2017-02-10 MED ORDER — ONDANSETRON HCL 4 MG/2ML IJ SOLN: 4.0000 mg | Freq: Four times a day (QID) | INTRAMUSCULAR | Status: DC | PRN

## 2017-02-10 MED ORDER — KETOROLAC TROMETHAMINE 30 MG/ML IJ SOLN: 30.0000 mg | Freq: Four times a day (QID) | INTRAMUSCULAR | Status: AC | PRN

## 2017-02-10 MED ORDER — SODIUM CHLORIDE 0.9 % IJ SOLN: INTRAMUSCULAR | Status: DC | PRN

## 2017-02-10 MED ORDER — MORPHINE SULFATE (PF) 0.5 MG/ML IJ SOLN
INTRAMUSCULAR | Status: DC | PRN
Start: 2017-02-10 — End: 2017-02-10
  Administered 2017-02-10: .2 mg via EPIDURAL

## 2017-02-10 MED ORDER — ACETAMINOPHEN 325 MG PO TABS: 650.0000 mg | ORAL_TABLET | ORAL | Status: DC | PRN

## 2017-02-10 MED ORDER — ONDANSETRON HCL 4 MG/2ML IJ SOLN
INTRAMUSCULAR | Status: AC
  Filled 2017-02-10: qty 2

## 2017-02-10 MED ORDER — OXYTOCIN 40 UNITS IN LACTATED RINGERS INFUSION - SIMPLE MED
2.5000 [IU]/h | INTRAVENOUS | Status: DC
  Administered 2017-02-11: 2.5 [IU]/h via INTRAVENOUS
  Filled 2017-02-10 (×2): qty 1000

## 2017-02-10 MED ORDER — WITCH HAZEL-GLYCERIN EX PADS: 1.0000 "application " | MEDICATED_PAD | CUTANEOUS | Status: DC | PRN

## 2017-02-10 MED ORDER — TETANUS-DIPHTH-ACELL PERTUSSIS 5-2.5-18.5 LF-MCG/0.5 IM SUSP: 0.5000 mL | Freq: Once | INTRAMUSCULAR | Status: DC

## 2017-02-10 MED ORDER — AMMONIA AROMATIC IN INHA
RESPIRATORY_TRACT | Status: AC
  Filled 2017-02-10: qty 10

## 2017-02-10 MED ORDER — LIDOCAINE HCL (PF) 1 % IJ SOLN
30.0000 mL | INTRAMUSCULAR | Status: DC | PRN
  Filled 2017-02-10: qty 30

## 2017-02-10 MED ORDER — BUPIVACAINE LIPOSOME 1.3 % IJ SUSP
20.0000 mL | Freq: Once | INTRAMUSCULAR | Status: DC
  Filled 2017-02-10: qty 20

## 2017-02-10 MED ORDER — ONDANSETRON HCL 4 MG/2ML IJ SOLN
INTRAMUSCULAR | Status: DC | PRN
  Administered 2017-02-10: 4 mg via INTRAVENOUS

## 2017-02-10 MED ORDER — SIMETHICONE 80 MG PO CHEW: 80.0000 mg | CHEWABLE_TABLET | ORAL | Status: DC | PRN

## 2017-02-10 MED ORDER — BUPIVACAINE HCL (PF) 0.5 % IJ SOLN
INTRAMUSCULAR | Status: AC
  Filled 2017-02-10: qty 30

## 2017-02-10 MED ORDER — DIBUCAINE 1 % RE OINT: 1.0000 "application " | TOPICAL_OINTMENT | RECTAL | Status: DC | PRN

## 2017-02-10 MED ORDER — FENTANYL CITRATE (PF) 100 MCG/2ML IJ SOLN
INTRAMUSCULAR | Status: AC
  Filled 2017-02-10: qty 2

## 2017-02-10 MED ORDER — BUTORPHANOL TARTRATE 1 MG/ML IJ SOLN: 1.0000 mg | INTRAMUSCULAR | Status: DC | PRN

## 2017-02-10 MED ORDER — BUPIVACAINE HCL (PF) 0.5 % IJ SOLN
INTRAMUSCULAR | Status: DC | PRN
  Administered 2017-02-10: 30 mL

## 2017-02-10 MED ORDER — BUPIVACAINE IN DEXTROSE 0.75-8.25 % IT SOLN
INTRATHECAL | Status: DC | PRN
  Administered 2017-02-10: 2 mL via INTRATHECAL

## 2017-02-10 MED ORDER — ONDANSETRON HCL 4 MG/2ML IJ SOLN: 4.0000 mg | Freq: Three times a day (TID) | INTRAMUSCULAR | Status: DC | PRN

## 2017-02-10 MED ORDER — DIPHENHYDRAMINE HCL 25 MG PO CAPS: 25.0000 mg | ORAL_CAPSULE | Freq: Four times a day (QID) | ORAL | Status: DC | PRN

## 2017-02-10 MED ORDER — IBUPROFEN 600 MG PO TABS
600.0000 mg | ORAL_TABLET | Freq: Four times a day (QID) | ORAL | Status: DC
  Administered 2017-02-10 – 2017-02-12 (×7): 600 mg via ORAL
  Filled 2017-02-10 (×7): qty 1

## 2017-02-10 MED ORDER — OXYTOCIN 10 UNIT/ML IJ SOLN
INTRAMUSCULAR | Status: AC
  Filled 2017-02-10: qty 2

## 2017-02-10 MED ORDER — FENTANYL CITRATE (PF) 100 MCG/2ML IJ SOLN
INTRAMUSCULAR | Status: DC | PRN
  Administered 2017-02-10: 15 ug via INTRAVENOUS

## 2017-02-10 MED ORDER — OXYCODONE-ACETAMINOPHEN 5-325 MG PO TABS: 2.0000 | ORAL_TABLET | ORAL | Status: DC | PRN

## 2017-02-10 MED ORDER — DIPHENHYDRAMINE HCL 25 MG PO CAPS: 25.0000 mg | ORAL_CAPSULE | ORAL | Status: DC | PRN

## 2017-02-10 MED ORDER — SIMETHICONE 80 MG PO CHEW
80.0000 mg | CHEWABLE_TABLET | ORAL | Status: DC
  Filled 2017-02-10: qty 1

## 2017-02-10 MED ORDER — LACTATED RINGERS IV SOLN
INTRAVENOUS | Status: DC
  Administered 2017-02-11: 11:00:00 via INTRAVENOUS

## 2017-02-10 MED ORDER — OXYTOCIN 40 UNITS IN LACTATED RINGERS INFUSION - SIMPLE MED
2.5000 [IU]/h | INTRAVENOUS | Status: DC
  Filled 2017-02-10: qty 1000

## 2017-02-10 MED ORDER — SODIUM CHLORIDE 0.9 % IJ SOLN
INTRAMUSCULAR | Status: AC
  Filled 2017-02-10: qty 50

## 2017-02-10 MED ORDER — NALBUPHINE HCL 10 MG/ML IJ SOLN: 5.0000 mg | INTRAMUSCULAR | Status: DC | PRN

## 2017-02-10 MED ORDER — MISOPROSTOL 200 MCG PO TABS
ORAL_TABLET | ORAL | Status: AC
  Filled 2017-02-10: qty 4

## 2017-02-10 MED ORDER — PHENYLEPHRINE HCL 10 MG/ML IJ SOLN
INTRAMUSCULAR | Status: AC
  Filled 2017-02-10: qty 1

## 2017-02-10 MED ORDER — COCONUT OIL OIL
1.0000 "application " | TOPICAL_OIL | Status: DC | PRN
  Filled 2017-02-10: qty 120

## 2017-02-10 MED ORDER — LACTATED RINGERS IV SOLN
500.0000 mL | INTRAVENOUS | Status: DC | PRN
  Administered 2017-02-10: 15:00:00 via INTRAVENOUS

## 2017-02-10 MED ORDER — ONDANSETRON HCL 4 MG/2ML IJ SOLN: 4.0000 mg | Freq: Once | INTRAMUSCULAR | Status: DC | PRN

## 2017-02-10 MED ORDER — PRENATAL MULTIVITAMIN CH
1.0000 | ORAL_TABLET | Freq: Every day | ORAL | Status: DC
  Administered 2017-02-11: 1 via ORAL
  Filled 2017-02-10: qty 1

## 2017-02-10 MED ORDER — MENTHOL 3 MG MT LOZG
1.0000 | LOZENGE | OROMUCOSAL | Status: DC | PRN
  Filled 2017-02-10: qty 9

## 2017-02-10 MED ORDER — SENNOSIDES-DOCUSATE SODIUM 8.6-50 MG PO TABS
2.0000 | ORAL_TABLET | ORAL | Status: DC
  Administered 2017-02-11 – 2017-02-12 (×2): 2 via ORAL
  Filled 2017-02-10 (×2): qty 2

## 2017-02-10 MED ORDER — SIMETHICONE 80 MG PO CHEW
80.0000 mg | CHEWABLE_TABLET | Freq: Three times a day (TID) | ORAL | Status: DC
  Administered 2017-02-11 – 2017-02-12 (×3): 80 mg via ORAL
  Filled 2017-02-10 (×4): qty 1

## 2017-02-10 MED ORDER — NALBUPHINE HCL 10 MG/ML IJ SOLN: 5.0000 mg | Freq: Once | INTRAMUSCULAR | Status: DC | PRN

## 2017-02-10 MED ORDER — LACTATED RINGERS IV SOLN
INTRAVENOUS | Status: DC
  Administered 2017-02-10: 06:00:00 via INTRAVENOUS

## 2017-02-10 MED ORDER — DIPHENHYDRAMINE HCL 50 MG/ML IJ SOLN: 12.5000 mg | INTRAMUSCULAR | Status: DC | PRN

## 2017-02-10 MED ORDER — CEFAZOLIN SODIUM-DEXTROSE 2-4 GM/100ML-% IV SOLN
2.0000 g | INTRAVENOUS | Status: AC
  Administered 2017-02-10: 2 g via INTRAVENOUS
  Filled 2017-02-10: qty 100

## 2017-02-10 MED ORDER — MORPHINE SULFATE (PF) 0.5 MG/ML IJ SOLN
INTRAMUSCULAR | Status: AC
  Filled 2017-02-10: qty 10

## 2017-02-10 MED ORDER — NALOXONE HCL 0.4 MG/ML IJ SOLN
1.0000 ug/kg/h | INTRAVENOUS | Status: DC | PRN
  Filled 2017-02-10: qty 5

## 2017-02-10 MED ORDER — OXYTOCIN BOLUS FROM INFUSION: 500.0000 mL | Freq: Once | INTRAVENOUS | Status: DC

## 2017-02-10 MED ORDER — OXYTOCIN 40 UNITS IN LACTATED RINGERS INFUSION - SIMPLE MED: 1.0000 m[IU]/min | INTRAVENOUS | Status: DC

## 2017-02-10 MED ORDER — OXYTOCIN 40 UNITS IN LACTATED RINGERS INFUSION - SIMPLE MED
INTRAVENOUS | Status: DC | PRN
  Administered 2017-02-10: 1000 mL via INTRAVENOUS

## 2017-02-10 MED ORDER — MEASLES, MUMPS & RUBELLA VAC ~~LOC~~ INJ
0.5000 mL | INJECTION | Freq: Once | SUBCUTANEOUS | Status: DC
  Filled 2017-02-10: qty 0.5

## 2017-02-10 MED ORDER — SOD CITRATE-CITRIC ACID 500-334 MG/5ML PO SOLN: 30.0000 mL | ORAL | Status: DC | PRN

## 2017-02-10 MED ORDER — FENTANYL CITRATE (PF) 100 MCG/2ML IJ SOLN: 25.0000 ug | INTRAMUSCULAR | Status: DC | PRN

## 2017-02-10 MED ORDER — FLEET ENEMA 7-19 GM/118ML RE ENEM: 1.0000 | ENEMA | Freq: Every day | RECTAL | Status: DC | PRN

## 2017-02-10 MED ORDER — SODIUM CHLORIDE 0.9% FLUSH: 3.0000 mL | INTRAVENOUS | Status: DC | PRN

## 2017-02-10 MED ORDER — NALOXONE HCL 0.4 MG/ML IJ SOLN: 0.4000 mg | INTRAMUSCULAR | Status: DC | PRN

## 2017-02-10 MED ORDER — SOD CITRATE-CITRIC ACID 500-334 MG/5ML PO SOLN
30.0000 mL | ORAL | Status: DC
  Filled 2017-02-10: qty 15

## 2017-02-10 SURGICAL SUPPLY — 30 items
BARRIER ADHS 3X4 INTERCEED (GAUZE/BANDAGES/DRESSINGS) ×3 IMPLANT
CANISTER SUCT 3000ML PPV (MISCELLANEOUS) ×3 IMPLANT
CHLORAPREP W/TINT 26ML (MISCELLANEOUS) ×3 IMPLANT
DERMABOND ADVANCED (GAUZE/BANDAGES/DRESSINGS) ×2
DERMABOND ADVANCED .7 DNX12 (GAUZE/BANDAGES/DRESSINGS) ×1 IMPLANT
DRSG OPSITE POSTOP 4X10 (GAUZE/BANDAGES/DRESSINGS) ×3 IMPLANT
DRSG TELFA 3X8 NADH (GAUZE/BANDAGES/DRESSINGS) IMPLANT
ELECT REM PT RETURN 9FT ADLT (ELECTROSURGICAL) ×3
ELECTRODE REM PT RTRN 9FT ADLT (ELECTROSURGICAL) ×1 IMPLANT
GAUZE SPONGE 4X4 12PLY STRL (GAUZE/BANDAGES/DRESSINGS) IMPLANT
GLOVE PROTEXIS LATEX SZ 7.5 (GLOVE) ×24 IMPLANT
GOWN STRL REUS W/ TWL LRG LVL3 (GOWN DISPOSABLE) ×3 IMPLANT
GOWN STRL REUS W/TWL LRG LVL3 (GOWN DISPOSABLE) ×6
NDL SAFETY ECLIPSE 18X1.5 (NEEDLE) ×1 IMPLANT
NEEDLE HYPO 18GX1.5 SHARP (NEEDLE) ×2
NEEDLE HYPO 22GX1.5 SAFETY (NEEDLE) ×3 IMPLANT
NS IRRIG 1000ML POUR BTL (IV SOLUTION) ×3 IMPLANT
PAD OB MATERNITY 4.3X12.25 (PERSONAL CARE ITEMS) ×3 IMPLANT
PAD PREP 24X41 OB/GYN DISP (PERSONAL CARE ITEMS) ×3 IMPLANT
SUT MNCRL 4-0 (SUTURE)
SUT MNCRL 4-0 27XMFL (SUTURE)
SUT PDS AB 1 TP1 96 (SUTURE) ×3 IMPLANT
SUT PLAIN 2 0 XLH (SUTURE) ×3 IMPLANT
SUT PLAIN GUT 2-0 30 C14 SG823 (SUTURE) ×3
SUT VIC AB 0 CT1 36 (SUTURE) ×9 IMPLANT
SUT VIC AB 3-0 SH 27 (SUTURE) ×2
SUT VIC AB 3-0 SH 27X BRD (SUTURE) ×1 IMPLANT
SUTURE MNCRL 4-0 27XMF (SUTURE) IMPLANT
SUTURE PLN GUT2-0 30 C14 SG823 (SUTURE) ×1 IMPLANT
SYR 30ML LL (SYRINGE) ×9 IMPLANT

## 2017-02-10 NOTE — Discharge Summary (Signed)
Obstetrical Discharge Summary  Patient Name: Leah Villarreal DOB: 1985-11-19 MRN: [id number]  Date of Admission: 02/10/2017 Date of Discharge: 02/12/2017  Primary OB:  ACHD  Gestational Age at Delivery: 5765w6d   Antepartum complications: macrocytosis without anemia, mild thombocytopenia Admitting Diagnosis: breech presentation at term Secondary Diagnosis: Patient Active Problem List   Diagnosis Date Noted  . Post term pregnancy at [redacted] weeks gestation 02/10/2017  . Anemia affecting pregnancy in third trimester 10/11/2016    Complications: None Intrapartum complications/course:  Normal primary cesarean delivery Date of Delivery: 02/11/16 Delivered By: Christeen DouglasBethany Beasley Delivery Type: primary cesarean section, low transverse incision Anesthesia: spinal Placenta: Spontaneous Laceration: none Episiotomy: none Newborn Data: Live born female "Onalee HuaDavid" Birth Weight: 9 lb 11.6 oz (4410 g) APGAR: 9, 9  Newborn Delivery   Birth date/time:  02/10/2017 15:06:00 Delivery type:  C-Section, Low Transverse C-section categorization:  Primary       Discharge Physical Exam:  BP 105/61 (BP Location: Left Arm)   Pulse 66   Temp 98.5 F (36.9 C) (Oral)   Resp 20   Ht 5\' 3"  (1.6 m)   Wt 170 lb (77.1 kg)   SpO2 94%   Breastfeeding? Unknown   BMI 30.11 kg/m   General: NAD CV: RRR Pulm: CTABL, nl effort ABD: s/nd/nt, fundus firm and below the umbilicus Lochia: moderate Incision: c/d/i  DVT Evaluation: LE non-ttp, no evidence of DVT on exam.  Hemoglobin  Date Value Ref Range Status  02/12/2017 10.0 (L) 12.0 - 16.0 g/dL Final   HGB  Date Value Ref Range Status  02/10/2011 12.2 12.0 - 16.0 g/dL Final   HCT  Date Value Ref Range Status  02/12/2017 28.2 (L) 35.0 - 47.0 % Final  02/08/2011 37.9 35.0 - 47.0 % Final    Post partum course: uncomplicated Postpartum Procedures: none Disposition: stable, discharge to home. Baby Feeding: breastmilk  Baby Disposition: home with  mom  Rh Immune globulin given: n/a Rubella vaccine given: n/a Tdap vaccine given in AP or PP setting: AP Flu vaccine given in AP or PP setting: AP  Contraception: mini-pill  Prenatal Labs: O+, RI, VI, HIV neg, RPR NR, HBsAg neg, 1h 92, GBS neg    Plan:  Leah Villarreal was discharged to home in good condition. [facility] with delivering provider in 2 weeks   Discharge Medications: Allergies as of 02/12/2017   No Known Allergies     Medication List    TAKE these medications   ibuprofen 600 MG tablet Commonly known as:  ADVIL,MOTRIN Take 1 tablet (600 mg total) by mouth every 6 (six) hours.   oxyCODONE-acetaminophen 5-325 MG tablet Commonly known as:  PERCOCET/ROXICET Take 1 tablet by mouth every 4 (four) hours as needed (pain scale 4-7).   PRENATAL VITAMIN PO Take by mouth.   senna-docusate 8.6-50 MG tablet Commonly known as:  Senokot-S Take 2 tablets by mouth daily. Start taking on:  02/13/2017       Follow-up Information    Christeen DouglasBeasley, Bethany, MD. Go on 02/25/2017.   Specialty:  Obstetrics and Gynecology Why:  For postop check  @ 1:30 pm Contact information: [address] [place] [address] [phone]           Signed: Heloise OchoaMcVey, Osmond Steckman A, CNM 02/12/17 10:44 AM

## 2017-02-10 NOTE — Progress Notes (Signed)
Pt to OR for C-Section

## 2017-02-10 NOTE — Anesthesia Procedure Notes (Signed)
Spinal  Patient location during procedure: OR Start time: 02/10/2017 2:40 PM End time: 02/10/2017 2:45 PM Staffing Anesthesiologist: Martha Clan, MD Resident/CRNA: Jonna Clark, CRNA Performed: resident/CRNA  Preanesthetic Checklist Completed: patient identified, site marked, surgical consent, pre-op evaluation, timeout performed, IV checked, risks and benefits discussed and monitors and equipment checked Spinal Block Patient position: sitting Prep: Betadine Patient monitoring: heart rate, continuous pulse ox, blood pressure and cardiac monitor Approach: midline Location: L4-5 Injection technique: single-shot Needle Needle type: Whitacre and Introducer  Needle gauge: 24 G Needle length: 9 cm Assessment Sensory level: T6 Additional Notes Negative paresthesia. Negative blood return. Positive free-flowing CSF. Expiration date of kit checked and confirmed. Patient tolerated procedure well, without complications.

## 2017-02-10 NOTE — Progress Notes (Signed)
Time should be 1345 no 145

## 2017-02-10 NOTE — Progress Notes (Signed)
31yo [id number] at 6690w6d presenting for iol and found to be in breech presentation.   The risks of cesarean section discussed with the patient included but were not limited to: bleeding which may require transfusion or reoperation; infection which may require antibiotics; injury to bowel, bladder, ureters or other surrounding organs; injury to the fetus; need for additional procedures including hysterectomy in the event of a life-threatening hemorrhage; placental abnormalities wth subsequent pregnancies, incisional problems, thromboembolic phenomenon and other postoperative/anesthesia complications. The patient concurred with the proposed plan, giving informed written consent for the procedure. Anesthesia and OR aware. Preoperative prophylactic antibiotics and SCDs ordered on call to the OR.  To OR when ready. Patient ID: Leah Villarreal, female   DOB: [birth year], 32 y.o.   MRN: [id number]

## 2017-02-10 NOTE — Progress Notes (Signed)
Patient arrived to labor and delivery for induction of labor. No complaints at this time.  EFM applied and assessing.

## 2017-02-10 NOTE — Progress Notes (Signed)
Labor Progress Note  Leah Villarreal is a 32 y.o. [phone] at 5957w6d by LMP admitted for IOL planned at 40+6, but fetus found to be breech.   Subjective: feeling well overall, some mild back pain. Denies feeling UCs, VB or LOF. Denies s/sx PRe-e.   Objective: BP 117/76 (BP Location: Right Arm)   Pulse 93   Temp 98.2 F (36.8 C) (Oral)   Resp 16   Ht 5\' 3"  (1.6 m)   Wt 170 lb (77.1 kg)   BMI 30.11 kg/m  Notable VS details: reviewed  Fetal Assessment: FHT:  FHR: 130 bpm, variability: moderate,  accelerations:  Present,  decelerations:  Absent Category/reactivity:  Category I UC:   irregular, every 5-10 minutes SVE:   Deferred;  Dilation: 3 Effacement (%): 50 Cervical Position: Middle Station: -3 Presentation: (Breech by bedside U/S) [facility]  Membrane status:intact  AFI- 5cm per formal US done this Am.   Labs: Lab Results  Component Value Date   WBC 7.7 02/10/2017   HGB 13.5 02/10/2017   HCT 37.8 02/10/2017   MCV 102.7 (H) 02/10/2017   PLT 123 (L) 02/10/2017    Assessment / Plan: 1. C/S planned for this afternoon by Dr Dalbert GarnetBeasley.  2. Gestational thrombocytopenia- monitor closely, no indications of pre-e or HELLP  Fetal Wellbeing:  Category I Pain Control:  plan for spinal I/D:  n/a, pre-op abx per Dr Dalbert Garnetbeasley.  Anticipated MOD:  primary LTCS for breech  McVey, REBECCA A, CNM 02/10/2017, 9:01 AM

## 2017-02-10 NOTE — H&P (Signed)
OB History & Physical   History of Present Illness:  Chief Complaint:   HPI:  Leah Villarreal is a 32 y.o. [phone] female at 10376w6d dated by 1866w2d ultrasound.  She presents to L&D for induction of labor for late term pregnancy. She reports good fetal movement, no vaginal bleeding, no leakage of fluid, and no contractions.   Pregnancy Issues: 1. Mild thrombocytopenia 2. Macrocytosis without anemia  Maternal Medical History:   Past Medical History:  Diagnosis Date  . Anemia   . Asthma    no recent attacks,  no meds    Past Surgical History:  Procedure Laterality Date  . NO PAST SURGERIES      No Known Allergies  Prior to Admission medications   Medication Sig Start Date End Date Taking? Authorizing Provider  Prenatal Vit-Fe Fumarate-FA (PRENATAL VITAMIN PO) Take by mouth.   Yes [provider]     [facility]  Social History: She  reports that  has never smoked. she has never used smokeless tobacco. She reports that she does not drink alcohol or use drugs.  Family History: family history is not on file.   Review of Systems: A full review of systems was performed and negative except as noted in the HPI.     Physical Exam:  Vital Signs: BP 117/84 (BP Location: Left Arm)   Pulse 80   Temp 97.9 F (36.6 C) (Oral)   Resp 18   Ht 5\' 3"  (1.6 m)   Wt 77.1 kg (170 lb)   BMI 30.11 kg/m   General:   alert, cooperative, appears stated age and no distress  Skin:  normal and no rash or abnormalities  Neurologic:    Alert & oriented x 3  Lungs:   clear to auscultation bilaterally  Heart:   regular rate and rhythm, S1, S2 normal, no murmur, click, rub or gallop  Abdomen:  soft, non-tender; bowel sounds normal; no masses,  no organomegaly  Pelvis:  Exam deferred.  FHT:  130 BPM  Presentations:  breech  Cervix:  by RN Graciela HusbandsKristen Cevallos' exam at [phone]   Dilation: 3cm   Effacement: 50%   Station:  -3   Consistency:  medium   Position: middle  Extremities: : non-tender, symmetric, no edema bilaterally.      EFW: 7 lbs  Results for orders placed or performed during the hospital encounter of 02/10/17 (from the past 24 hour(s))  CBC     Status: Abnormal   Collection Time: 02/10/17  5:38 AM  Result Value Ref Range   WBC 7.7 3.6 - 11.0 K/uL   RBC 3.69 (L) 3.80 - 5.20 MIL/uL   Hemoglobin 13.5 12.0 - 16.0 g/dL   HCT 29.537.8 62.135.0 - 30.847.0 %   MCV 102.7 (H) 80.0 - 100.0 fL   MCH 36.6 (H) 26.0 - 34.0 pg   MCHC 35.7 32.0 - 36.0 g/dL   RDW 65.713.1 84.611.5 - 96.214.5 %   Platelets 123 (L) 150 - 440 K/uL  Urinalysis, Routine w reflex microscopic     Status: Abnormal   Collection Time: 02/10/17  5:38 AM  Result Value Ref Range   Color, Urine YELLOW (A) YELLOW   APPearance HAZY (A) CLEAR   Specific Gravity, Urine 1.005 1.005 - 1.030   pH 6.0 5.0 - 8.0   Glucose, UA NEGATIVE NEGATIVE mg/dL   Hgb urine dipstick SMALL (A) NEGATIVE   Bilirubin Urine NEGATIVE NEGATIVE   Ketones, ur NEGATIVE NEGATIVE  mg/dL   Protein, ur NEGATIVE NEGATIVE mg/dL   Nitrite NEGATIVE NEGATIVE   Leukocytes, UA TRACE (A) NEGATIVE   RBC / HPF 0-5 0 - 5 RBC/hpf   WBC, UA 0-5 0 - 5 WBC/hpf   Bacteria, UA MANY (A) NONE SEEN   Squamous Epithelial / LPF 0-5 (A) NONE SEEN   Mucus PRESENT    Amorphous Crystal PRESENT     Pertinent Results:  Prenatal Labs: Blood type/Rh O+  Antibody screen neg  Rubella Immune  Varicella Immune  RPR NR  HBsAg Neg  HIV NR  GC neg  Chlamydia neg  1 hour GTT 92  3 hour GTT n/a  GBS neg   FHT: baseline 130bpm, moderate variability, 15x15 accels present, decels absent: category I TOCO: occasional contraction, pt reports not feeling anything SVE:  Dilation: 3 / Effacement (%): 50 / Station: -3    Breech by leopolds and bedside ultrasound  Assessment:  Amayah Staheli is a 32 y.o. G84P3003 female at [redacted]w[redacted]d with breech presentation.   Plan:  1. Admit to Labor & Delivery 2. CBC, T&S, Clrs,  IVF 3. GBS neg  4. Consents obtained. 5. Continuous EFM/toco 6. Formal ultrasound to assess placental position and fluid level. Discussed breech position and possibility of ECV with Dr. Christeen Douglas, who said that patient may be a candidate based on ultrasound findings.    Genia Del, CNM 02/10/2017 6:55 AM ----- Genia Del Certified Nurse Midwife [facility], Department of OB/GYN [facility]

## 2017-02-10 NOTE — Anesthesia Preprocedure Evaluation (Signed)
Anesthesia Evaluation  Patient identified by MRN, date of birth, ID band Patient awake    Reviewed: Allergy & Precautions, NPO status , Patient's Chart, lab work & pertinent test results, reviewed documented beta blocker date and time   Airway Mallampati: III  TM Distance: >3 FB     Dental  (+) Chipped   Pulmonary           Cardiovascular      Neuro/Psych    GI/Hepatic   Endo/Other    Renal/GU      Musculoskeletal   Abdominal   Peds  Hematology  (+) anemia ,   Anesthesia Other Findings   Reproductive/Obstetrics                             Anesthesia Physical Anesthesia Plan  ASA: III  Anesthesia Plan: Spinal   Post-op Pain Management:    Induction:   PONV Risk Score and Plan:   Airway Management Planned:   Additional Equipment:   Intra-op Plan:   Post-operative Plan:   Informed Consent: I have reviewed the patients History and Physical, chart, labs and discussed the procedure including the risks, benefits and alternatives for the proposed anesthesia with the patient or authorized representative who has indicated his/her understanding and acceptance.     Plan Discussed with: CRNA  Anesthesia Plan Comments:         Anesthesia Quick Evaluation

## 2017-02-10 NOTE — Op Note (Signed)
  Cesarean Section Procedure Note  Date of procedure: 02/10/2017   Pre-operative Diagnosis: Intrauterine pregnancy at 3720w6d; breech presentation; AFI of 5  Post-operative Diagnosis: same, delivered.  Procedure: Primary Low Transverse Cesarean Section through Pfannenstiel incision  Surgeon: Christeen DouglasBethany Teala Daffron   Assistant(s):  CST  Anesthesia: Spinal anesthesia  Estimated Blood Loss:  800         Total IV Fluids: 750ml  Urine Output: 175ml         Specimens: Cord blood for O positive         Complications:  None; patient tolerated the procedure well.         Disposition: PACU - hemodynamically stable.         Condition: stable  Findings:  A female infant "Onalee HuaDavid" in breech, frank presentation. Amniotic fluid - Clear  Birth weight 4410 g, 9#12oz Apgars of 9 and 9 at one and five minutes respectively.  Intact placenta with a three-vessel cord.  Grossly normal uterus, tubes and ovaries bilaterally. No intraabdominal adhesions were noted.  Indications: malpresentation: frank breech  Procedure Details  The patient was taken to Operating Room, identified as the correct patient and the procedure verified as C-Section Delivery. A formal Time Out was held with all team members present and in agreement.  After induction of spinal anesthesia, the patient was draped and prepped in the usual sterile manner. A Pfannenstiel skin incision was made and carried down through the subcutaneous tissue to the fascia. Fascial incision was made and extended transversely with the Mayo scissors. The fascia was separated from the underlying rectus tissue superiorly and inferiorly. The peritoneum was identified and entered bluntly. Peritoneal incision was extended longitudinally. The utero-vesical peritoneal reflection was incised transversely and a bladder flap was created digitally.   A low transverse hysterotomy was made. The fetus was delivered in a breech presentation atraumatically, using standard breech  manuvers. The umbilical cord was clamped x2 and cut and the infant was handed to the awaiting pediatricians. The placenta was removed intact and appeared normal, intact, and with a 3-vessel cord.   The uterus was exteriorized and cleared of all clot and debris. The hysterotomy was closed with running sutures of 0-Vicryl. A second imbricating layer was placed with the same suture. Excellent hemostasis was observed. The peritoneal cavity was cleared of all clots and debris. The uterus was returned to the abdomen.   The pelvis was irrigated and again, excellent hemostasis was noted. The fascia was then reapproximated with running sutures of 0 Vicryl.  The subcutaneous tissue was reapproximated with running sutures of 2-0 vicryl. The skin was reapproximated with a 4-0 Monocryl subcuticular stitch. 20ml of Exparel injected along fascia and skin lines.  Instrument, sponge, and needle counts were correct prior to the abdominal closure and at the conclusion of the case.   The patient tolerated the procedure well and was transferred to the recovery room in stable condition.   Christeen DouglasBethany Tiwan Schnitker, MD 02/10/2017

## 2017-02-10 NOTE — Anesthesia Post-op Follow-up Note (Signed)
Anesthesia QCDR form completed.        

## 2017-02-10 NOTE — Progress Notes (Signed)
Labor Progress Note  Leah Villarreal is a 32 y.o. [phone] at 6941w6d by ultrasound admitted for induction of labor due to late term pregnancy.  Subjective:  Patient feeling nervous about need for c-section.    Objective: BP 117/76 (BP Location: Right Arm)   Pulse 93   Temp 98.2 F (36.8 C) (Oral)   Resp 16   Ht 5\' 3"  (1.6 m)   Wt 77.1 kg (170 lb)   BMI 30.11 kg/m   Fetal Assessment: FHT:  FHR: 135 bpm, variability: moderate,  accelerations:  Present,  decelerations:  Present non-current mild lates Category/reactivity:  Category II UC:  q5-8, pt reports mild or not feeling at all SVE:  Deferred  Membrane status: intact Amniotic color: n/a  Labs: Lab Results  Component Value Date   WBC 7.7 02/10/2017   HGB 13.5 02/10/2017   HCT 37.8 02/10/2017   MCV 102.7 (H) 02/10/2017   PLT 123 (L) 02/10/2017    Assessment / Plan: Presented for IOL, fetal position found to be breech, c-section planned for this afternoon  Dr. Christeen DouglasBethany Beasley present with spanish language interpreter to discuss results of ultrasound and plan for c-section in the setting of breech position and too little amniotic fluid to attempt an external cephalic version. Patient disappointed that she will not be able to have a vaginal delivery and nervous about the c-section and all related components. Reassured patient that it is normal to feel surprised and scared in this situation and that she can ask any additional questions that come up at anytime.   Fetal Wellbeing:  Category II for non-recurrent mild lates, overall reassuring Anticipated MOD:  LTCS, scheduled for 1300 today  Genia DelMargaret Elfreida Heggs, CNM 02/10/2017, 8:25 AM

## 2017-02-10 NOTE — Transfer of Care (Signed)
Immediate Anesthesia Transfer of Care Note  Patient: Leah Villarreal  Procedure(s) Performed: CESAREAN SECTION (N/A )  Patient Location: PACU and Nursing Unit  Anesthesia Type:Spinal  Level of Consciousness: awake, alert  and oriented  Airway & Oxygen Therapy: Patient Spontanous Breathing  Post-op Assessment: Report given to RN and Post -op Vital signs reviewed and stable  Post vital signs: Reviewed and stable  Last Vitals:  Vitals:   02/10/17 1058 02/10/17 1556  BP: 117/63 (!) 94/59  Pulse: 76 77  Resp: 16 14  Temp: 36.9 C   SpO2:  100%    Last Pain:  Vitals:   02/10/17 1152  TempSrc:   PainSc: 0-No pain      Patients Stated Pain Goal: 0 (02/10/17 0719)  Complications: No apparent anesthesia complications

## 2017-02-10 NOTE — Consult Note (Signed)
Neonatology Note:   Attendance at C-section:    I was asked by Dr. Beasley toDalbert Garnet attend this primary C/S at term due to breech presentation. The mother is a G4P3 O pos, GBS neg with an uncomplicated pregnancy. ROM at delivery, fluid clear. Infant delivered frank breech and was vigorous with good spontaneous cry and tone. Needed only minimal bulb suctioning. Delayed cord clamping was done. Ap 9/9. Lungs clear to ausc in DR. Infant is LGA. To CN to care of Pediatrician.   Doretha Souhristie C. Jream Broyles, MD

## 2017-02-10 NOTE — Discharge Instructions (Signed)
Parto por cesárea °Cesarean Delivery °El nacimiento o parto por cesárea es el parto quirúrgico de un bebé a través de una incisión en el abdomen y el útero. Se lo puede llamar incisión cesárea. Este procedimiento se puede programar con anticipación o se puede realizar en una situación de emergencia. °Informe al médico acerca de lo siguiente: °· Cualquier alergia que tenga. °· Todos los medicamentos que utiliza, incluidos vitaminas, hierbas, gotas oftálmicas, cremas y medicamentos de venta libre. °· Cualquier problema previo que usted o los miembros de su familia hayan tenido con anestésicos. °· Enfermedades de la sangre que tenga. °· Cirugías previas. °· Cualquier enfermedad que tenga. °· Si usted o algún familiar tiene antecedentes de trombosis venosa profunda (TVP) o embolia pulmonar (EP). °¿Cuáles son los riesgos? °En general, se trata de un procedimiento seguro. Sin embargo, pueden ocurrir complicaciones, por ejemplo: °· Una infección. °· Hemorragia. °· Reacciones alérgicas a los medicamentos. °· Daños a otras estructuras u otros órganos. °· Coágulos sanguíneos. °· Lesiones al bebé. ° °¿Qué ocurre antes del procedimiento? °· Siga las indicaciones del médico respecto de las restricciones para las comidas o las bebidas. °· Siga las indicaciones del médico respecto de bañarse antes del procedimiento para ayudar a reducir el riesgo de infección. °· Si sabe que va a tener un parto por cesárea, no se rasure la zona púbica. Rasurarse antes del procedimiento puede aumentar el riesgo de infección. °· Consulte al médico si debe hacer o no lo siguiente: °? Cambiar o suspender los medicamentos que toma habitualmente. Esto es muy importante si toma medicamentos para la diabetes o anticoagulantes. °? El plan para controlar el dolor. Esto es muy importante si piensa amamantar a su bebé. °? Cuánto tiempo permanecerá en el hospital después del procedimiento. °? Cualquier inquietud que pueda tener sobre recibir hemoderivados si  los necesita durante el procedimiento. °? Almacenamiento de sangre del cordón, si planea guardar la sangre del cordón umbilical del bebé. °· Quizá también desee preguntarle al médico lo siguiente: °? Si va a poder sostener o amamantar a su bebé mientras aún se encuentre en el quirófano. °? Si su bebé puede quedarse con usted inmediatamente después del procedimiento y durante su recuperación. °? Si un familiar o la persona que usted elija puede ingresar al quirófano y permanecer con usted durante el procedimiento, inmediatamente después de este y durante su recuperación. °· Haga arreglos para que alguien la lleve a su casa cuando le den el alta hospitalaria. °¿Qué ocurre durante el procedimiento? °· Se le colocarán monitores fetales en el abdomen para controlar la frecuencia cardíaca de su bebé y la suya. °· Según el motivo del parto por cesárea, es posible que se le realice un examen físico o una prueba adicional, como una ecografía. °· Le colocarán una vía intravenosa (IV) en una de las venas. °· Posiblemente le realicen un análisis de sangre u orina. °· Se le administrarán antibióticos para ayudar a prevenir las infecciones. °· Se le puede entregar una bata especial de calentamiento para mantener estable la temperatura corporal. °· Pueden rasurarle a zona púbica. °· Le limpiarán la piel de la zona púbica y de la parte inferior del abdomen con una solución para destruir las bacterias (antiséptico). °· Se le puede insertar un catéter en la vejiga a través de la uretra. Esto se hace para drenar la orina durante el procedimiento. °· Pueden administrarle uno o más de los siguientes medicamentos: °? Un medicamento para adormecer la zona (anestesia local). °? Un medicamento que lo hará   dormir (anestesia general). °? Un medicamento (anestesia regional) que se le inyecta en la espalda o a través de un tubo pequeño y delgado que se le coloca en la espalda (anestesia raquídea o anestesia epidural). Esto adormece la parte del  cuerpo que está por debajo del lugar de la inyección y le permite permanecer despierta durante el procedimiento. Si llega a sentir náuseas, dígaselo al médico. Tendrá medicamentos disponibles para ayudarla a reducir cualquier náusea que pueda sentir. °· Le harán una incisión en el abdomen y luego en el útero. °· Si está despierta durante el procedimiento, puede sentir tirones en el abdomen, pero no debería sentir dolor. Si siente dolor, dígaselo al médico inmediatamente. °· Se sacará al bebé del útero. Es posible que sienta más presión o un tirón, mientras esto sucede. °· Inmediatamente después del parto, se secará al bebé y se lo mantendrá caliente. Podrá sostener y amamantar a su bebé. Durante este momento, es posible que se pince y corte el cordón umbilical. °· Se le extraerá la placenta del útero. °· Las incisiones se cerrarán con puntos (suturas). Es posible que se apliquen grapas, pegamento para la piel o tiras adhesivas en la incisión del abdomen. °· Se colocarán vendas (vendajes) sobre la incisión del abdomen. °Este procedimiento puede variar según el médico y el hospital. °¿Qué sucede después del procedimiento? °· Le controlarán con frecuencia la presión arterial, la frecuencia cardíaca, la frecuencia respiratoria y el nivel de oxígeno en la sangre hasta que haya desaparecido el efecto de los medicamentos administrados. °· Puede seguir recibiendo líquidos o medicamentos por la vía intravenosa. °· Sentirá un poco de dolor. Tendrá analgésicos disponibles para ayudarla a controlar el dolor. °· Para evitar la formación de coágulos sanguíneos: °? Se le pueden administrar medicamentos. °? Quizás deba usar medias o dispositivos de compresión. °? Se le indicará que camine cuando pueda hacerlo. °· El personal del hospital alentará y apoyará que cree lazos con su bebé. En el hospital, se le puede permitir que el bebé permanezca en la misma habitación que usted (internación conjunta) durante la hospitalización para  fomentar un amamantamiento exitoso. °· Se le puede sugerir que tosa y respire profundamente con frecuencia. Esto ayuda a evitar problemas pulmonares. °· Si tiene un catéter que le drena la orina, se le quitará lo antes posible después del procedimiento. °Esta información no tiene como fin reemplazar el consejo del médico. Asegúrese de hacerle al médico cualquier pregunta que tenga. °Document Released: 01/21/2005 Document Revised: 04/22/2016 Document Reviewed: 11/01/2014 °Elsevier Interactive Patient Education © 2018 [employer] ° °

## 2017-02-11 ENCOUNTER — Encounter: Payer: Self-pay | Admitting: Obstetrics and Gynecology

## 2017-02-11 LAB — CBC
HEMATOCRIT: 28.7 % — AB (ref 35.0–47.0)
HEMOGLOBIN: 10.4 g/dL — AB (ref 12.0–16.0)
MCH: 36.8 pg — AB (ref 26.0–34.0)
MCHC: 36.1 g/dL — AB (ref 32.0–36.0)
MCV: 102 fL — ABNORMAL HIGH (ref 80.0–100.0)
Platelets: 98 10*3/uL — ABNORMAL LOW (ref 150–440)
RBC: 2.82 MIL/uL — ABNORMAL LOW (ref 3.80–5.20)
RDW: 13 % (ref 11.5–14.5)
WBC: 9.2 10*3/uL (ref 3.6–11.0)

## 2017-02-11 LAB — COMPREHENSIVE METABOLIC PANEL
ALK PHOS: 116 U/L (ref 38–126)
ALT: 15 U/L (ref 14–54)
AST: 34 U/L (ref 15–41)
Albumin: 2.4 g/dL — ABNORMAL LOW (ref 3.5–5.0)
Anion gap: 6 (ref 5–15)
BILIRUBIN TOTAL: 1 mg/dL (ref 0.3–1.2)
BUN: 8 mg/dL (ref 6–20)
CALCIUM: 8.1 mg/dL — AB (ref 8.9–10.3)
CO2: 22 mmol/L (ref 22–32)
Chloride: 102 mmol/L (ref 101–111)
Creatinine, Ser: 0.69 mg/dL (ref 0.44–1.00)
Glucose, Bld: 86 mg/dL (ref 65–99)
Potassium: 3.6 mmol/L (ref 3.5–5.1)
Sodium: 130 mmol/L — ABNORMAL LOW (ref 135–145)
TOTAL PROTEIN: 4.9 g/dL — AB (ref 6.5–8.1)

## 2017-02-11 LAB — RPR: RPR Ser Ql: NONREACTIVE

## 2017-02-11 MED ORDER — LACTATED RINGERS IV BOLUS (SEPSIS)
500.0000 mL | Freq: Once | INTRAVENOUS | Status: AC
  Administered 2017-02-11: 500 mL via INTRAVENOUS

## 2017-02-11 NOTE — Progress Notes (Signed)
Post Partum Day 1 Subjective: Doing well, no complaints.  Tolerating clear liquid diet, pain with PO meds, voiding and ambulating without difficulty.  No CP SOB Fever,Chills, N/V or leg pain; denies nipple or breast pain no HA change of vision, RUQ/epigastric pain  Objective: BP (!) 97/50 (BP Location: Right Arm)   Pulse 73   Temp 98 F (36.7 C) (Oral)   Resp 20   Ht 5\' 3"  (1.6 m)   Wt 170 lb (77.1 kg)   SpO2 98%   Breastfeeding? Unknown   BMI 30.11 kg/m    Physical Exam:  General: NAD Breasts: soft/nontender CV: RRR Pulm: nl effort, CTABL Abdomen: soft, NT, BS x 4 Incision: Dsg CDI/no erythema or drainage Lochia: moderate Uterine Fundus: fundus firm and 2 fb below umbilicus DVT Evaluation: no cords, ttp LEs   Recent Labs    02/10/17 0538 02/11/17 0541  HGB 13.5 10.4*  HCT 37.8 28.7*  WBC 7.7 9.2  PLT 123* 98*    Assessment/Plan: 31 y.o. [id number] postpartum day # 1  1. S/p LTCS for breech with oligo at 4765w6d 2. Thrombocytopenia- CMP WNL, BP normotensive, .  3. Decreased urine output midnight to 5am- picking up more now, PItocin stopped, LR bolus 500ml given. Will continue to monitor- will need Foley removed.   - Continue routine PP care; needs to advance diet as tolerated today - encouraged up to bedside and ambulation x 3 today.  - Lactation consult prn.  - Acute blood loss anemia - hemodynamically stable and asymptomatic; start po ferrous sulfate BID with stool softeners at discharge - Immunization status:  all Imms up to date  Consulted with Dr Elesa MassedWard, oncoming staff regarding UOP and thrombocytopenia    Disposition: continue routine Post-op care    McVey, REBECCA A, CNM 02/11/17 8:04 AM

## 2017-02-11 NOTE — Anesthesia Postprocedure Evaluation (Signed)
Anesthesia Post Note  Patient: Leah Villarreal  Procedure(s) Performed: CESAREAN SECTION (N/A )  Patient location during evaluation: Nursing Unit Anesthesia Type: Spinal Level of consciousness: awake Pain management: pain level controlled Vital Signs Assessment: post-procedure vital signs reviewed and stable Respiratory status: spontaneous breathing Cardiovascular status: blood pressure returned to baseline Postop Assessment: no headache Anesthetic complications: no     Last Vitals:  Vitals:   02/11/17 0347 02/11/17 0801  BP: (!) 103/46 (!) 97/50  Pulse: 76 73  Resp: 18 20  Temp: 36.7 C 36.7 C  SpO2: 100% 98%    Last Pain:  Vitals:   02/11/17 0840  TempSrc:   PainSc: 4                  Vernie MurdersPope,  Rhythm Gubbels G

## 2017-02-12 LAB — CBC
HEMATOCRIT: 28.2 % — AB (ref 35.0–47.0)
Hemoglobin: 10 g/dL — ABNORMAL LOW (ref 12.0–16.0)
MCH: 36.5 pg — ABNORMAL HIGH (ref 26.0–34.0)
MCHC: 35.2 g/dL (ref 32.0–36.0)
MCV: 103.6 fL — ABNORMAL HIGH (ref 80.0–100.0)
PLATELETS: 123 10*3/uL — AB (ref 150–440)
RBC: 2.73 MIL/uL — AB (ref 3.80–5.20)
RDW: 13.2 % (ref 11.5–14.5)
WBC: 8.2 10*3/uL (ref 3.6–11.0)

## 2017-02-12 MED ORDER — IBUPROFEN 600 MG PO TABS: 600.0000 mg | ORAL_TABLET | Freq: Four times a day (QID) | ORAL | 0 refills | Status: AC

## 2017-02-12 MED ORDER — SENNOSIDES-DOCUSATE SODIUM 8.6-50 MG PO TABS: 2.0000 | ORAL_TABLET | ORAL | 0 refills | Status: AC

## 2017-02-12 MED ORDER — OXYCODONE-ACETAMINOPHEN 5-325 MG PO TABS: 1.0000 | ORAL_TABLET | ORAL | 0 refills | Status: AC | PRN

## 2017-02-12 NOTE — Progress Notes (Signed)
Reviewed all patients discharge instructions and handouts via interpreter Orson Slick(Jacqui) per patient requests regarding postpartum bleeding, no intercourse for 6 weeks, signs and symptoms of mastitis and postpartum bleu's. Reviewed discharge instructions for newborn regarding proper cord care, how and when to bathe the newborn, nail care, proper way to take the baby's temperature, along with safe sleep. All questions have been answered at this time. Patient discharged via wheelchair with axillary.

## 2017-02-12 NOTE — Progress Notes (Signed)
Post Partum Day 2 Subjective: Doing well, no complaints.  Tolerating regular diet, pain with PO meds, voiding and ambulating without difficulty.  No CP SOB Fever,Chills, N/V or leg pain; denies nipple or breast pain no HA change of vision, RUQ/epigastric pain  Objective: BP 105/61 (BP Location: Left Arm)   Pulse 66   Temp 98.5 F (36.9 C) (Oral)   Resp 20   Ht 5\' 3"  (1.6 m)   Wt 170 lb (77.1 kg)   SpO2 94%   Breastfeeding? Unknown   BMI 30.11 kg/m    Physical Exam:  General: NAD Breasts: soft/nontender CV: RRR Pulm: nl effort, CTABL Abdomen: soft, NT, BS x 4 Incision: honeycomb dsg CDI/no erythema or drainage  Lochia: small Uterine Fundus: fundus firm and 1 fb below umbilicus DVT Evaluation: no cords, ttp LEs   Recent Labs    02/11/17 0541 02/12/17 0920  HGB 10.4* 10.0*  HCT 28.7* 28.2*  WBC 9.2 8.2  PLT 98* 123*    Assessment/Plan: 32 y.o. [id number] postpartum day # 2  - Continue routine PP care - Lactation consult  - Discussed contraceptive options including implant, IUDs hormonal and non-hormonal, injection, pills/ring/patch, condoms, and NFP.- desires pills - Acute blood loss anemia - mild, hemodynamically stable and asymptomatic; continue PNV daily with stool softeners - gestational thrombocytopenia- platelets repeat today at baseline.   - Immunization status:  all Imms up to date    Disposition: Does desire Dc home today.     Kayde Atkerson A, CNM @TODAY @ 10:00 AM

## 2017-07-19 IMAGING — US US OB COMP LESS 14 WK
1 series · 14 of 28 positions shown · non-contrast
Comparison: None.

CLINICAL DATA: Unsure of menstrual dates

EXAM:
OBSTETRIC <14 WK ULTRASOUND
TECHNIQUE: Transabdominal ultrasound was performed for evaluation of the
gestation as well as the maternal uterus and adnexal regions.

[Series 1: us ob comp less 14 wk · 0.15mm/px · 14 of 58 slices shown]
[im 3/58]
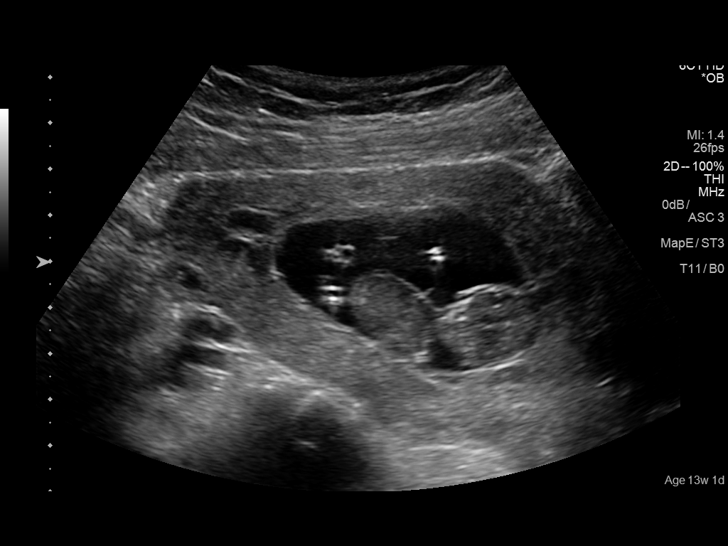
[im 7/58]
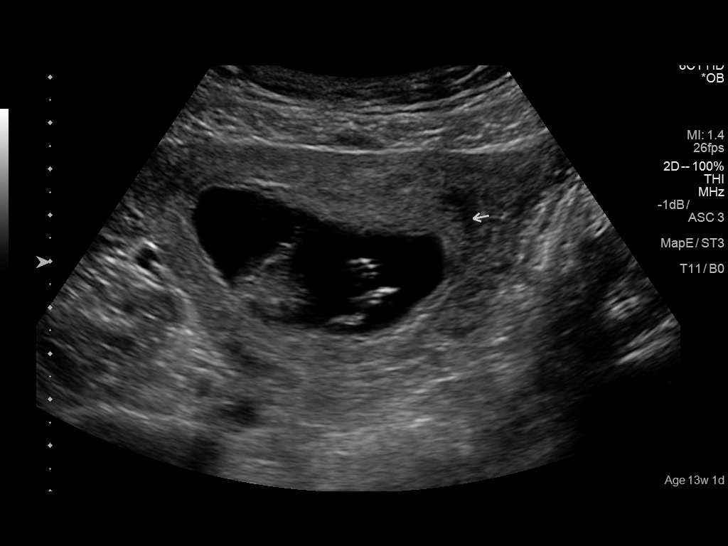
[im 11/58]
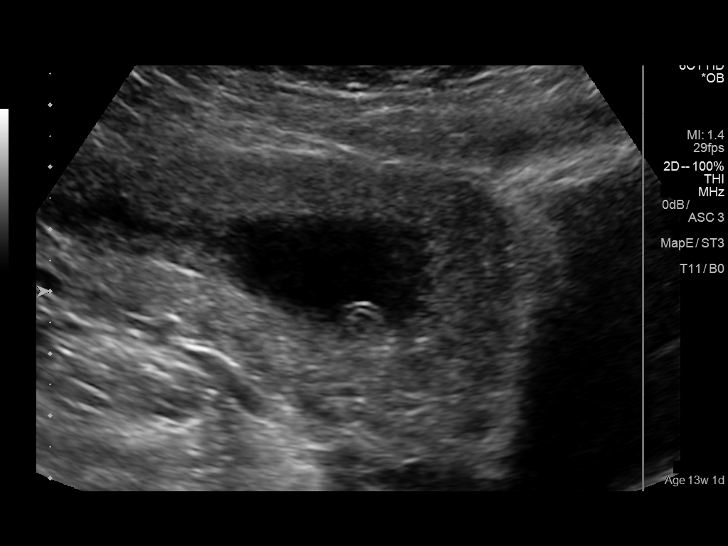
[im 15/58]
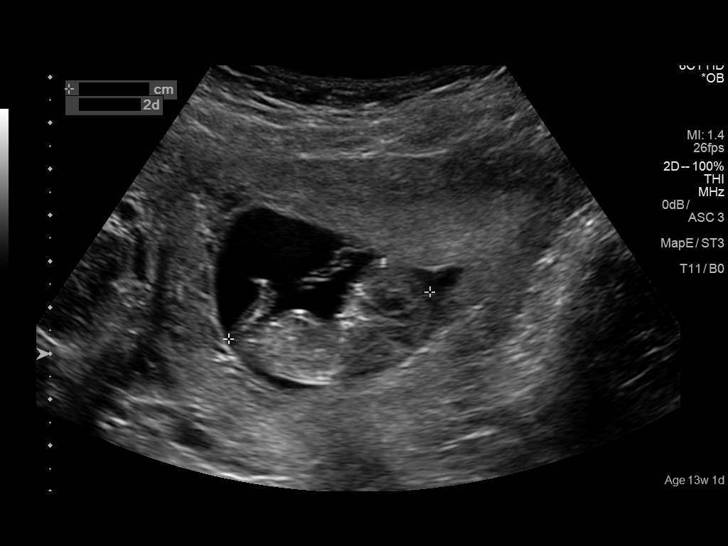
[im 20/58]
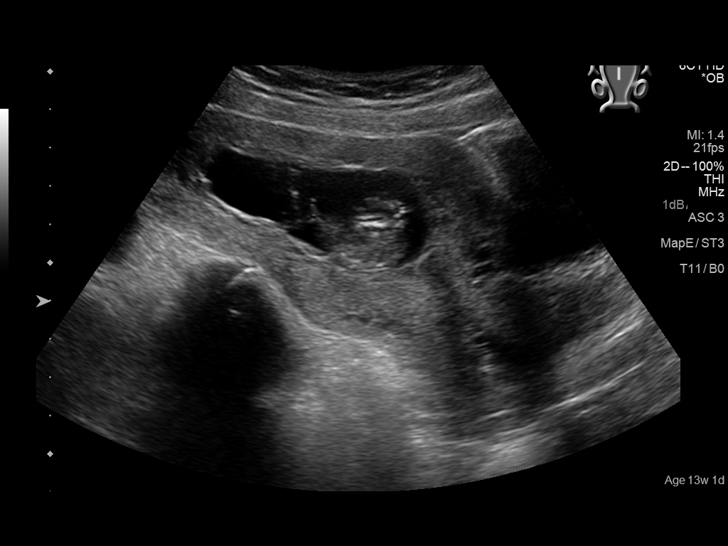
[im 24/58]
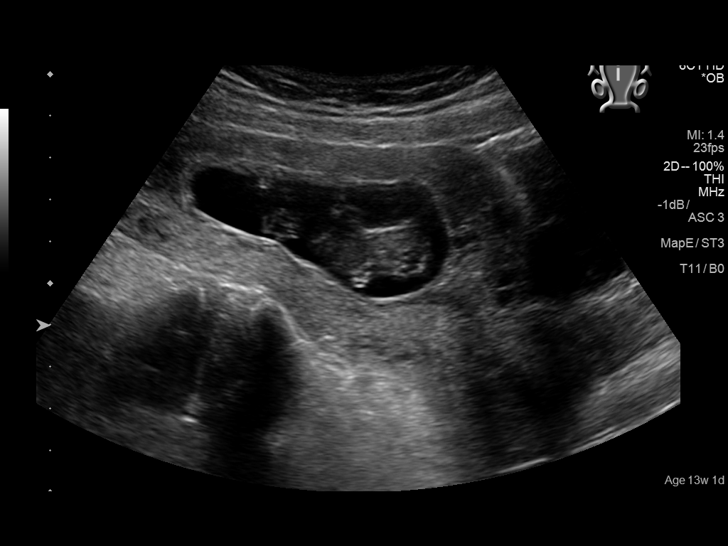
[im 28/58]
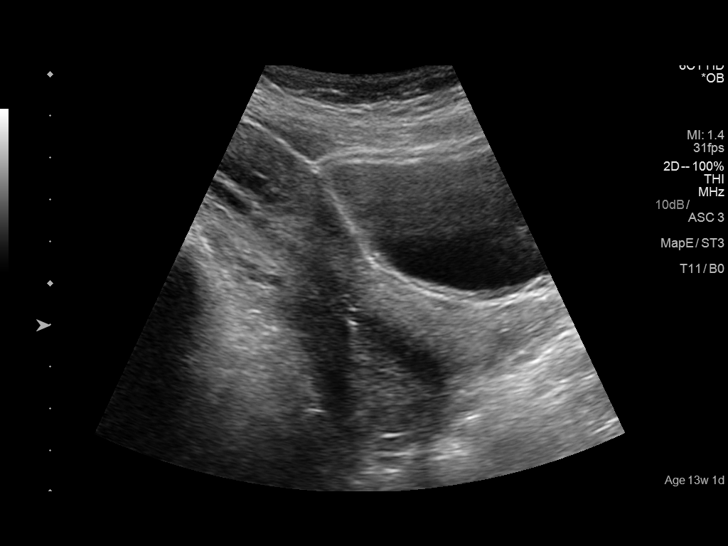
[im 32/58]
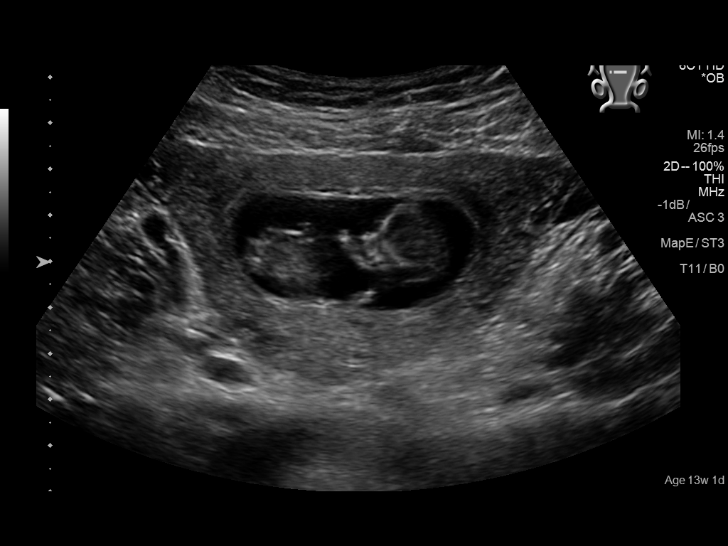
[im 36/58]
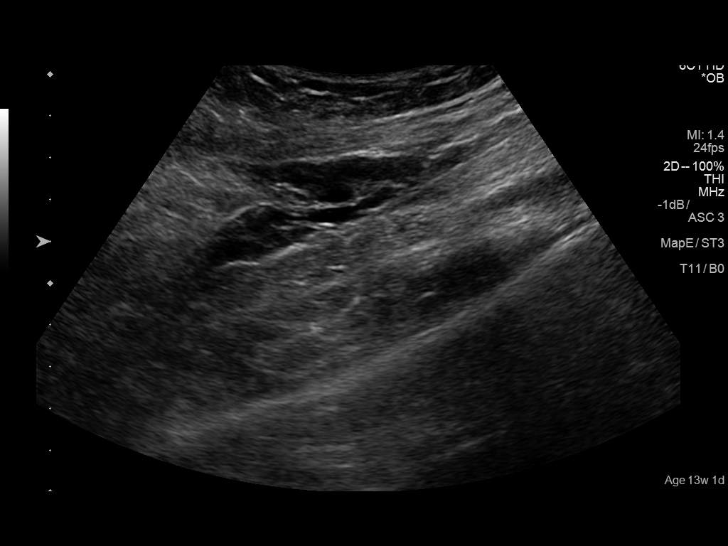
[im 41/58]
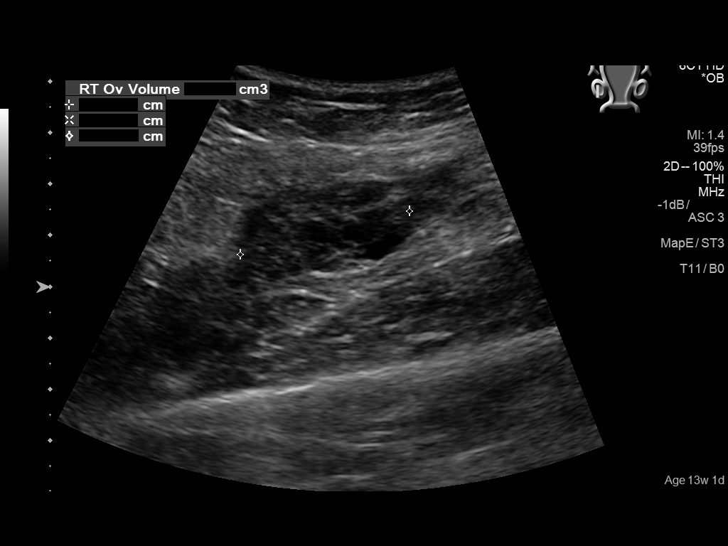
[im 45/58]
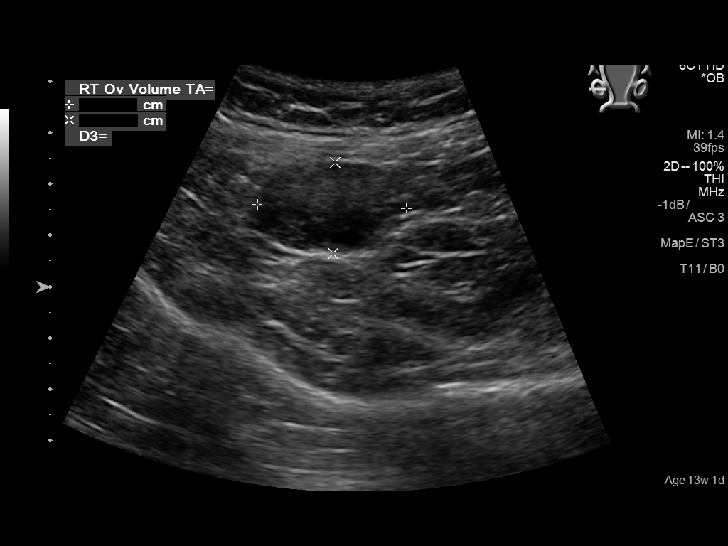
[im 49/58]
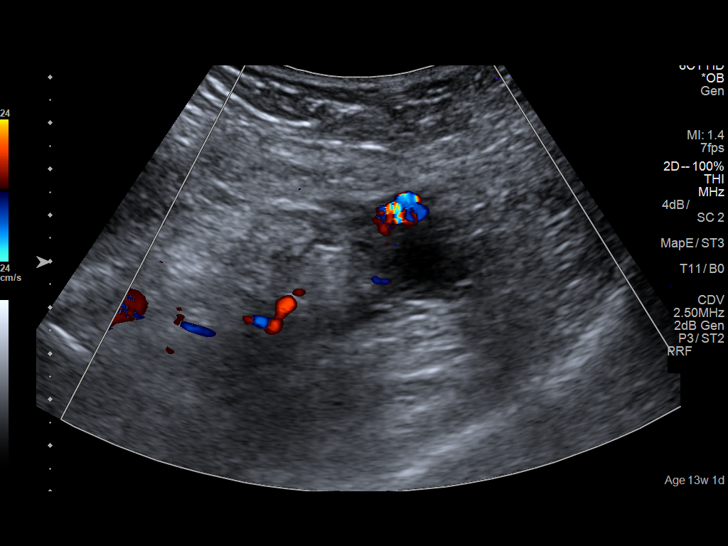
[im 53/58]
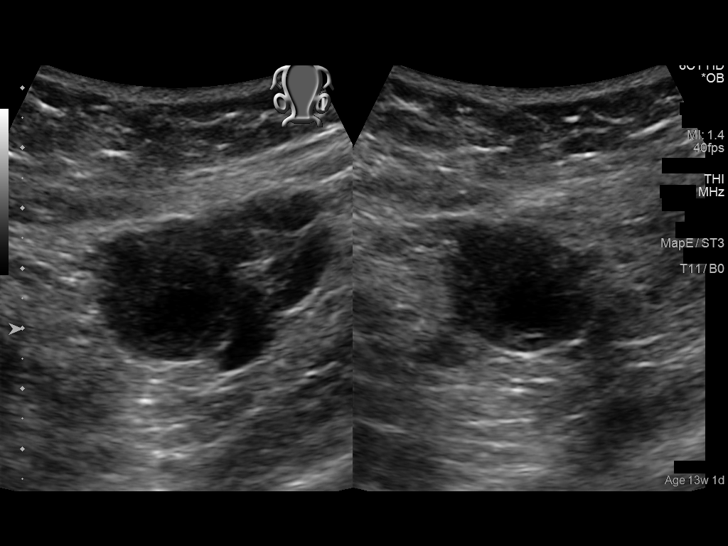
[im 58/58]
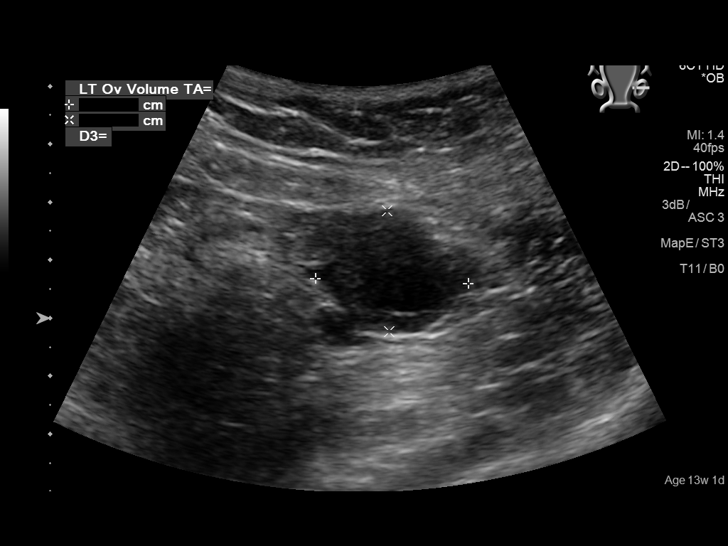

[14 of 28 positions shown; findings below may reference images not displayed]

FINDINGS: Intrauterine gestational sac: Visualized

Yolk sac:  Visualized

Embryo:  Visualized

Cardiac Activity: Visualized

Heart Rate: 169 bpm

CRL:   45 mm   11 w 2 d                  US EDC: February 04, 2017

Subchorionic hemorrhage: There is a subchorionic hemorrhage
measuring 1.3 x 1.1 cm.

Maternal uterus/adnexae: Cervical os is closed. Uterus otherwise
unremarkable in appearance. Maternal ovaries appear normal in size
and contour. There is an apparent corpus luteum on the left
measuring 2.2 x 1.7 x 2.3 cm. No free pelvic fluid.
IMPRESSION: Single live intrauterine gestation with estimated gestational age of
11+ weeks based on crown-rump length. Small subchorionic hemorrhage.
Study otherwise unremarkable.

## 2017-09-04 IMAGING — US US OB COMP +14 WK
1 series · 14 of 28 positions shown · non-contrast
Comparison: none

CLINICAL DATA: Second trimester pregnancy.  Fetal evaluation.

EXAM:
OBSTETRICAL ULTRASOUND >14 WKS

[Series 1: us ob comp +14 wk · 0.19mm/px · 14 of 80 slices shown]
[im 3/80]
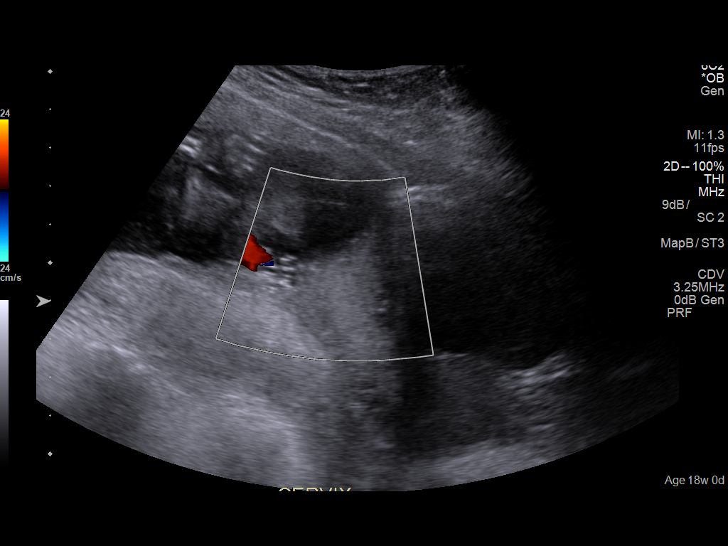
[im 9/80]
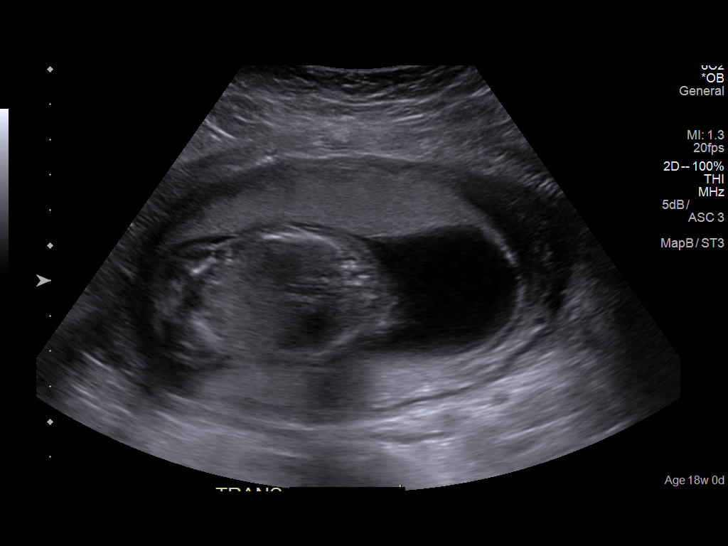
[im 15/80]
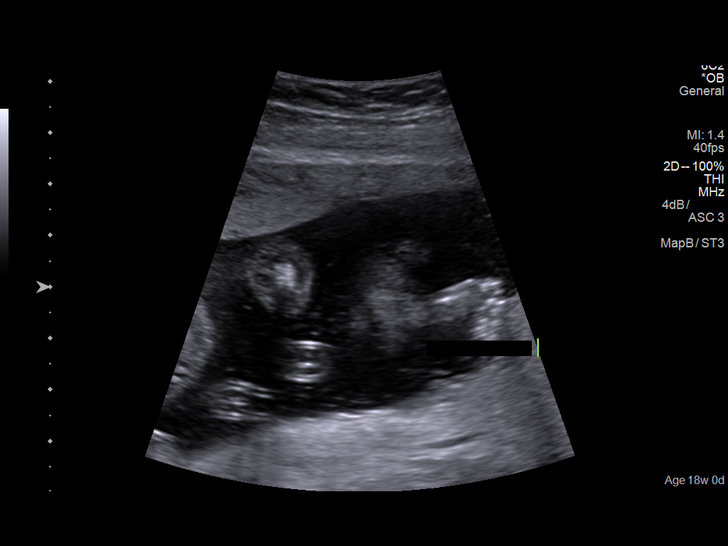
[im 21/80]
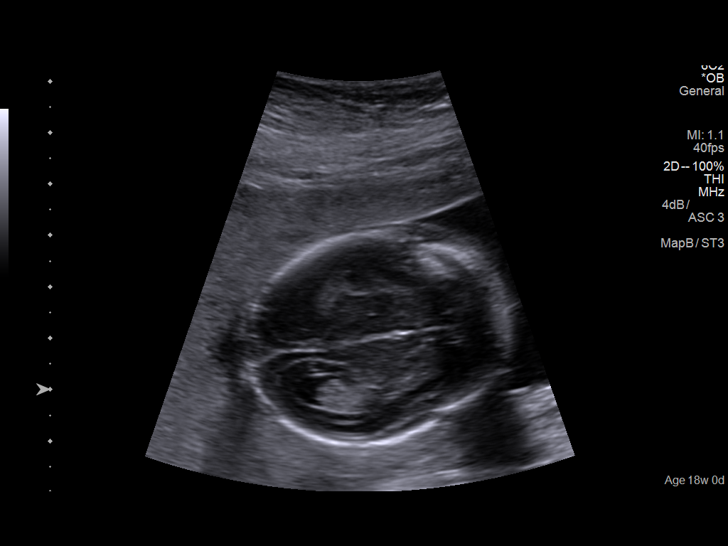
[im 27/80]
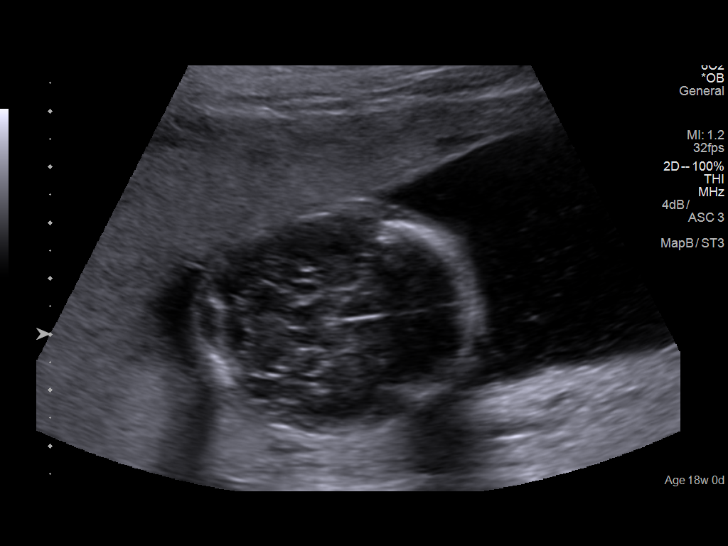
[im 33/80]
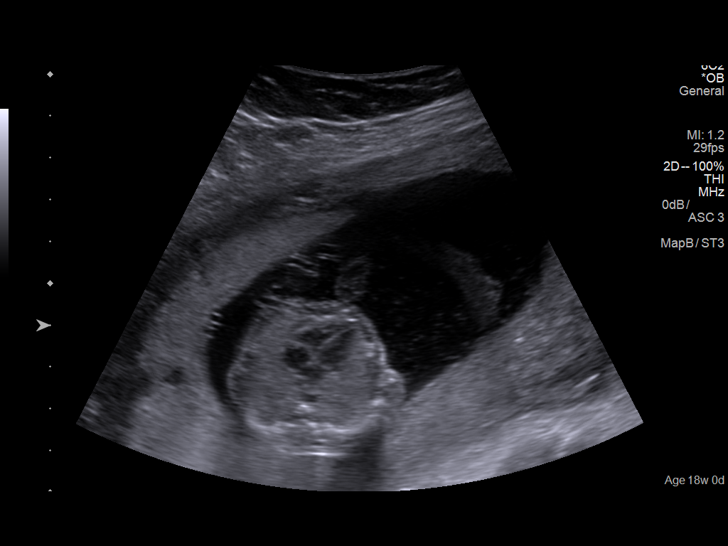
[im 39/80]
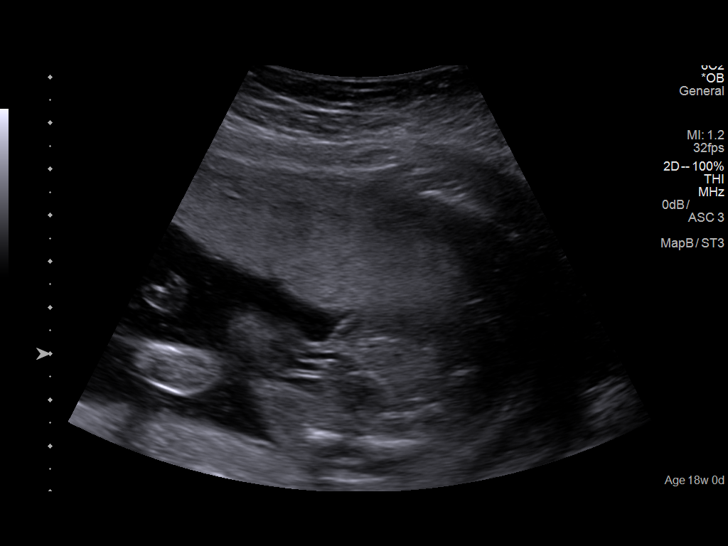
[im 44/80]
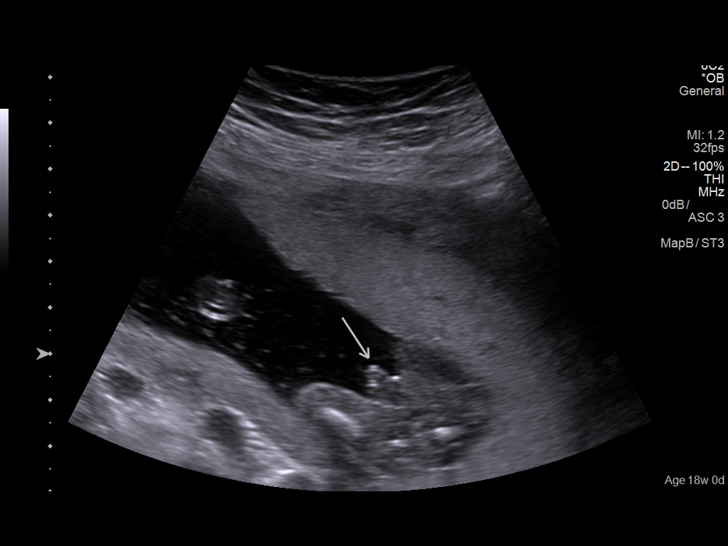
[im 50/80]
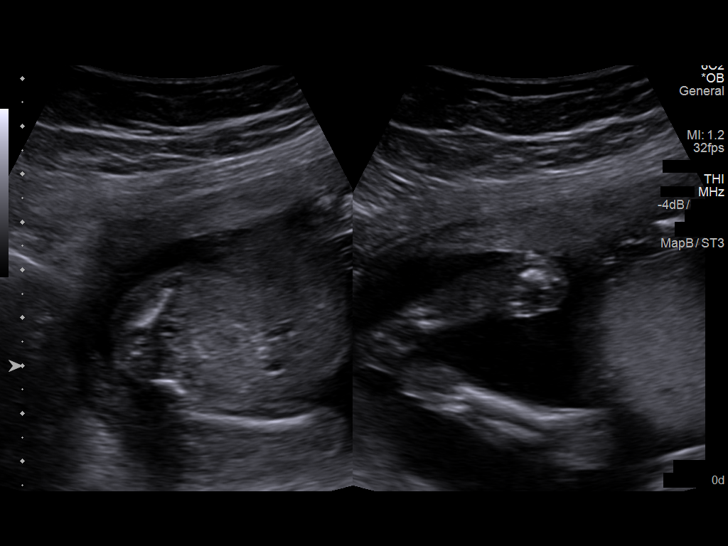
[im 56/80]
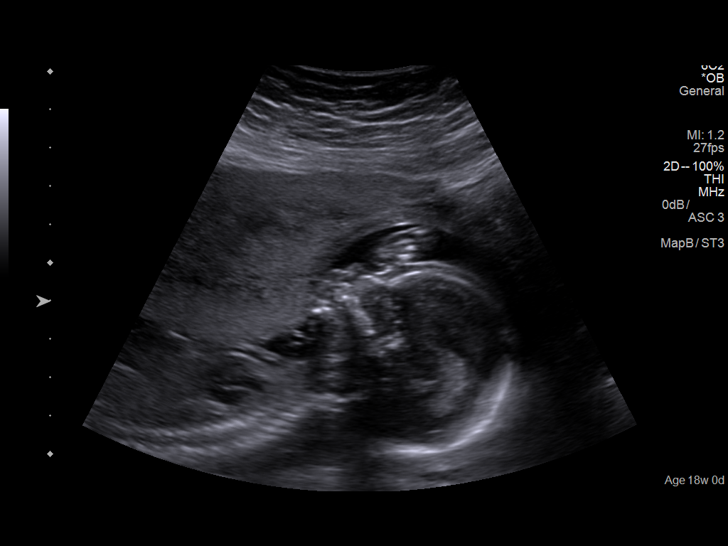
[im 62/80]
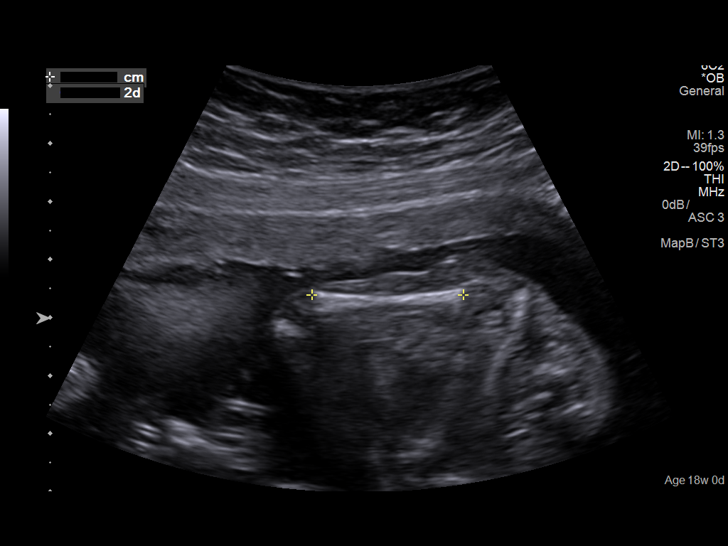
[im 68/80]
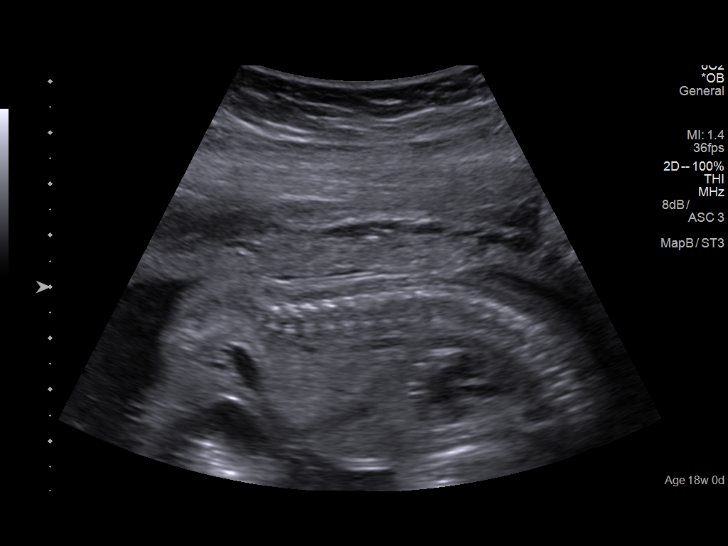
[im 74/80]
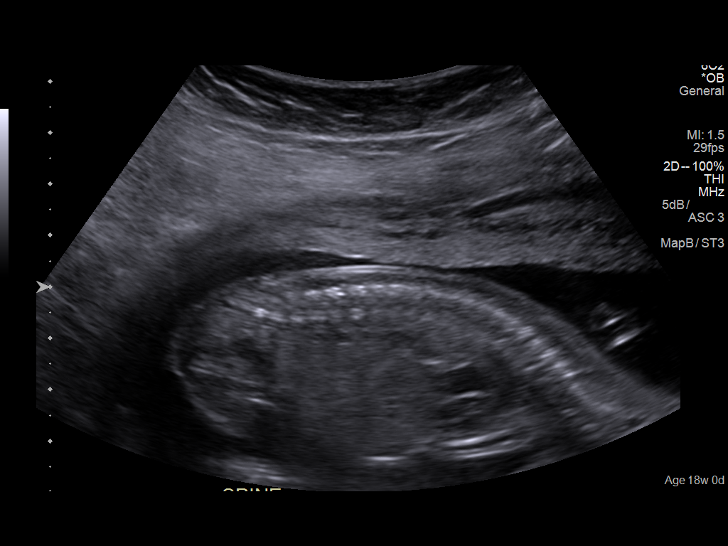
[im 80/80]
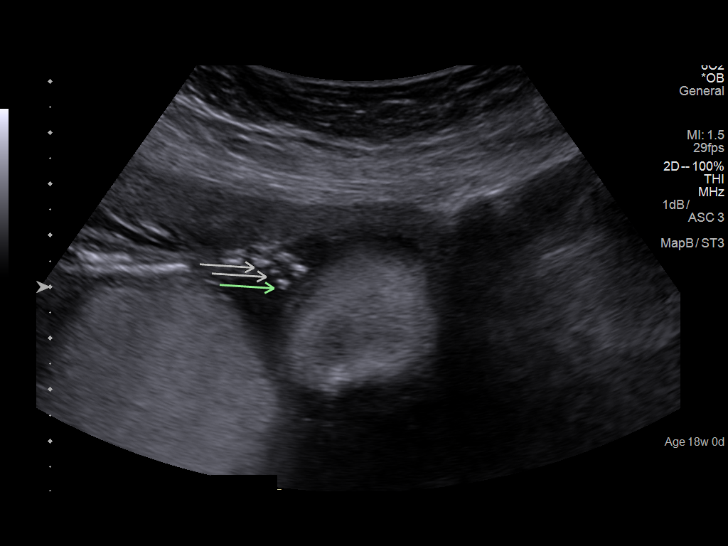

[14 of 28 positions shown; findings below may reference images not displayed]

FINDINGS: Number of Fetuses: 1

Heart Rate:  139 bpm

Movement: Present

Presentation: Send

Previa: No

Placental Location: Fundal

Amniotic Fluid (Subjective): Normal

Amniotic Fluid (Objective):

Vertical pocket 3.6cm

FETAL BIOMETRY

BPD:  4.1cm 18w 3d

HC:    14.9cm  18w   0d

AC:   12.4cm  18w   0d

FL:   2.8cm  18w   3d

Current Mean GA: 18w 2d              US EDC: 02/02/2017

FETAL ANATOMY

Lateral Ventricles: Visualized

Thalami/CSP: Visualized

Posterior Fossa:  Visualized

Nuchal Region: Visualized

Upper Lip: Visualized

Spine: Visualized

4 Chamber Heart on Left: Visualized

LVOT: Visualized

RVOT: Visualized

Stomach on Left: Visualized

3 Vessel Cord: Visualized

Cord Insertion site: Visualized

Kidneys: Visualized

Bladder: Visualized

Extremities: Visualized

Maternal Findings:

Cervix:  4.1 cm and closed.
IMPRESSION: Single viable intrauterine pregnancy at 18 weeks 2 days.

## 2019-01-25 ENCOUNTER — Other Ambulatory Visit: Payer: Self-pay

## 2019-01-25 ENCOUNTER — Ambulatory Visit (LOCAL_COMMUNITY_HEALTH_CENTER): Payer: Self-pay

## 2019-01-25 VITALS — BP 100/65 | Ht 65.0 in | Wt 151.5 lb

## 2019-01-25 DIAGNOSIS — Z30011 Encounter for initial prescription of contraceptive pills: Secondary | ICD-10-CM

## 2019-01-25 DIAGNOSIS — Z3009 Encounter for other general counseling and advice on contraception: Secondary | ICD-10-CM

## 2019-01-25 MED ORDER — NORGESTIM-ETH ESTRAD TRIPHASIC 0.18/0.215/0.25 MG-35 MCG PO TABS: 1.0000 | ORAL_TABLET | Freq: Every day | ORAL | 0 refills | Status: AC

## 2019-01-25 MED ORDER — MULTI-VITAMIN/MINERALS PO TABS: 1.0000 | ORAL_TABLET | Freq: Every day | ORAL | 0 refills | Status: AC

## 2019-01-25 NOTE — Progress Notes (Signed)
Folic acid counseling completed and MVI dispensed. Rich Number, RN

## 2019-04-01 ENCOUNTER — Encounter: Payer: Self-pay | Admitting: Physician Assistant

## 2019-04-01 ENCOUNTER — Ambulatory Visit (LOCAL_COMMUNITY_HEALTH_CENTER): Payer: Self-pay | Admitting: Physician Assistant

## 2019-04-01 ENCOUNTER — Other Ambulatory Visit: Payer: Self-pay

## 2019-04-01 VITALS — BP 104/66 | Wt 151.0 lb

## 2019-04-01 DIAGNOSIS — Z309 Encounter for contraceptive management, unspecified: Secondary | ICD-10-CM

## 2019-04-01 MED ORDER — THERA VITAL M PO TABS: 1.0000 | ORAL_TABLET | Freq: Every day | ORAL | 0 refills | Status: AC

## 2019-04-01 MED ORDER — NORGESTIM-ETH ESTRAD TRIPHASIC 0.18/0.215/0.25 MG-35 MCG PO TABS
1.0000 | ORAL_TABLET | Freq: Every day | ORAL | 11 refills | Status: AC
Start: 2019-04-01 — End: ?

## 2019-04-01 NOTE — Progress Notes (Signed)
  Family Planning Visit- Repeat Yearly Visit  Subjective:  Leah Villarreal is a 34 y.o. G4P4 being seen today for an well woman visit and to discuss family planing options.    She is currently using OCP (estrogen/progesterone) for pregnancy prevention. Patient reports she does not want a pregnancy in the next year. Patient  has Anemia affecting pregnancy in third trimester on their problem list. Note pt is not currently pregnant.  Chief Complaint  Patient presents with  . Contraception    Patient reports she has been using combined OCPs without problems except she occasionally forgets pills. The forgotten ones are always the "last row" of inactive pills the week she gets her period. LMP 02/27/19, expects menses any time. Last sex 2/18 without condoms. Last complete physical 03/2018, last Pap normal 07/2016.  Patient denies desire for STI or pregnancy testing. Denies vag discharge, HA, bleeding or clotting disorders.    Does the patient desire a pregnancy in the next year? (OKQ flowsheet) No.  See flowsheet for other program required questions.   Body mass index is 25.13 kg/m. - Patient is eligible for diabetes screening based on BMI and age >19?  not applicable HA1C ordered? no  Patient reports 1 of partners in last year. Desires STI screening?  No - single partner, no sx, declines  Does the patient have a current or past history of drug use? No   No components found for: HCV]   Health Maintenance Due  Topic Date Due  . TETANUS/TDAP  06/30/2006  . INFLUENZA VACCINE  09/05/2018    ROS  The following portions of the patient's history were reviewed and updated as appropriate: allergies, current medications, past family history, past medical history, past social history, past surgical history and problem list. Problem list updated.  Objective:   Vitals:   04/01/19 0842  BP: 104/66  Weight: 151 lb (68.5 kg)    Physical Exam      Assessment and Plan:  Leah Villarreal is a 34 y.o. female presenting to the [facility] for an initial well woman exam/family planning visit  Contraception counseling: Reviewed all forms of birth control options in the tiered based approach. available including abstinence; over the counter/barrier methods; hormonal contraceptive medication including pill, patch, ring, injection,contraceptive implant; hormonal and nonhormonal IUDs; permanent sterilization options including vasectomy and the various tubal sterilization modalities. Risks, benefits, and typical effectiveness rates were reviewed.  Questions were answered.  Written information was also given to the patient to review.  Patient desires OCPs while awaiting IUD, this was prescribed for patient. She will follow up in  1 week for IUD insertion (next available appt).  She was told to call with any further questions, or with any concerns about this method of contraception.  Emphasized use of condoms 100% of the time for STI prevention.  Patient was not offered ECP.   1. Encounter for contraceptive management, unspecified type Continue Ortho Tricyclen/equivalent one by mouth daily - give 1 pack of pills to cover til IUD insertion, ideally during menses, next week. Routine cervical cancer screening due 07/2019.     Return in about 1 week (around 04/08/2019) for IUD insertion.  Future Appointments  Date Time Provider Department Center  04/08/2019  8:20 AM [facility] PROVIDER [facility] None    Landry Dyke, PA-C

## 2019-04-01 NOTE — Progress Notes (Signed)
Patient here to discuss BCM options. Currently using OC. States she has trouble remembering to take OC and wants to switch methods.Burt Knack, RN

## 2019-04-01 NOTE — Progress Notes (Addendum)
Patient wants IUD, Mirena. Patient scheduled for 04/08/2019, consult done today. Patient given one pack OC per provider orders (OTC #[id number], exp. 06/04/2019), and counseled to start at appropriate time. Counseled no sex until after IUD appointment. Patient given condoms, PCP list and MVI today. Burt Knack, RN

## 2019-04-08 ENCOUNTER — Other Ambulatory Visit: Payer: Self-pay

## 2019-04-08 ENCOUNTER — Ambulatory Visit (LOCAL_COMMUNITY_HEALTH_CENTER): Payer: Self-pay | Admitting: Family Medicine

## 2019-04-08 ENCOUNTER — Encounter: Payer: Self-pay | Admitting: Family Medicine

## 2019-04-08 VITALS — BP 97/62 | Wt 149.6 lb

## 2019-04-08 DIAGNOSIS — Z30430 Encounter for insertion of intrauterine contraceptive device: Secondary | ICD-10-CM

## 2019-04-08 DIAGNOSIS — Z113 Encounter for screening for infections with a predominantly sexual mode of transmission: Secondary | ICD-10-CM

## 2019-04-08 MED ORDER — LEVONORGESTREL 20 MCG/24HR IU IUD
1.0000 | INTRAUTERINE_SYSTEM | Freq: Once | INTRAUTERINE | Status: AC
  Administered 2019-04-08: 1 via INTRAUTERINE

## 2019-04-08 NOTE — Addendum Note (Signed)
Addended by: Geanie Berlin on: 04/08/2019 10:09 AM   Modules accepted: Orders

## 2019-04-08 NOTE — Progress Notes (Signed)
Patient presented to [facility] for IUD insertion. Her GC/CT screening was found to be up to date and using WHO criteria we can be reasonably certain she is not pregnant or a pregnancy test was obtained which was Urine pregnancy test  today was N/A.  See Flowsheet for IUD check list  IUD Insertion Procedure Note Patient identified, informed consent performed, consent signed.   Discussed risks of irregular bleeding, cramping, infection, malpositioning or misplacement of the IUD outside the uterus which may require further procedure such as laparoscopy. Time out was performed.    Speculum placed in the vagina. Collected GCCT given last test was > 2 yrs prior. Cervix visualized.  Cleaned with Betadine x 2.  Uterus sounded to 8 cm. No tenaculum used IUD placed per manufacturer's recommendations.  Strings trimmed to 3 cm. Patient tolerated procedure well.   Patient was given post-procedure instructions- both agency handout and verbally by provider.  She was advised to have backup contraception for one week.  Patient was also asked to check IUD strings periodically or follow up in 4 weeks for IUD check.  Orders Placed This Encounter  Procedures  . Chlamydia/Gonorrhea [facility]

## 2019-04-08 NOTE — Progress Notes (Signed)
Patient here for IUD insertion, Mirena. Consult done at last appointment. Patient states lmp 04/02/2019, last sex 03/26/2019. Last GC/Chl. 01/10/2017, negative. Patient states she forgot to take ibuprofen this morning, but counseled she can take some when she gets home.Burt Knack, RN

## 2019-05-10 ENCOUNTER — Ambulatory Visit: Payer: Self-pay | Attending: Internal Medicine

## 2019-05-10 DIAGNOSIS — Z23 Encounter for immunization: Secondary | ICD-10-CM

## 2019-05-10 NOTE — Progress Notes (Signed)
   Covid-19 Vaccination Clinic  Name:  Leah Villarreal    MRN: [id number] DOB: [birth year]  05/10/2019  Ms. Leah Villarreal was observed post Covid-19 immunization for 15 minutes without incident. She was provided with Vaccine Information Sheet and instruction to access the V-Safe system. Medical Interpreter used.  Ms. Leah Villarreal was instructed to call 911 with any severe reactions post vaccine: Marland Kitchen Difficulty breathing  . Swelling of face and throat  . A fast heartbeat  . A bad rash all over body  . Dizziness and weakness   Immunizations Administered    Name Date Dose VIS Date Route   Pfizer COVID-19 Vaccine 05/10/2019  4:28 PM 0.3 mL 01/15/2019 Intramuscular   Manufacturer: [employer], [employer]   Lot: [id number]   NDC: [address]-8

## 2019-05-31 ENCOUNTER — Ambulatory Visit: Payer: Self-pay | Attending: Internal Medicine

## 2019-06-04 ENCOUNTER — Ambulatory Visit: Payer: Self-pay | Attending: Internal Medicine

## 2019-06-04 DIAGNOSIS — Z23 Encounter for immunization: Secondary | ICD-10-CM

## 2019-06-04 NOTE — Progress Notes (Signed)
   Covid-19 Vaccination Clinic  Name:  Leah Villarreal    MRN: [id number] DOB: September 01, 1985  06/04/2019  Ms. Leah Villarreal was observed post Covid-19 immunization for 15 minutes without incident. She was provided with Vaccine Information Sheet and instruction to access the V-Safe system.   Ms. Leah Villarreal was instructed to call 911 with any severe reactions post vaccine: Marland Kitchen Difficulty breathing  . Swelling of face and throat  . A fast heartbeat  . A bad rash all over body  . Dizziness and weakness   Immunizations Administered    Name Date Dose VIS Date Route   Pfizer COVID-19 Vaccine 06/04/2019 10:33 AM 0.3 mL 03/31/2018 Intramuscular   Manufacturer: [employer], [employer]   Lot: [id number]   NDC: [address]-9

## 2019-11-03 ENCOUNTER — Other Ambulatory Visit: Payer: Self-pay

## 2019-11-03 DIAGNOSIS — Z20822 Contact with and (suspected) exposure to covid-19: Secondary | ICD-10-CM

## 2019-11-04 LAB — SARS-COV-2, NAA 2 DAY TAT

## 2019-11-04 LAB — NOVEL CORONAVIRUS, NAA: SARS-CoV-2, NAA: NOT DETECTED

## 2020-02-29 ENCOUNTER — Ambulatory Visit (LOCAL_COMMUNITY_HEALTH_CENTER): Payer: Self-pay | Admitting: Physician Assistant

## 2020-02-29 ENCOUNTER — Other Ambulatory Visit: Payer: Self-pay

## 2020-02-29 VITALS — BP 95/60 | Ht 63.0 in | Wt 142.0 lb

## 2020-02-29 DIAGNOSIS — Z309 Encounter for contraceptive management, unspecified: Secondary | ICD-10-CM

## 2020-02-29 DIAGNOSIS — Z3009 Encounter for other general counseling and advice on contraception: Secondary | ICD-10-CM

## 2020-02-29 NOTE — Progress Notes (Unsigned)
Needs IUD string trimmed. Richmond Campbell, RN

## 2020-03-01 NOTE — Progress Notes (Signed)
[facility]  Subjective:  Leah Villarreal is a 35 y.o. being seen today for IUD check.  Chief Complaint  Patient presents with  . Contraception    Wants IUD strings trimmed    HPI Patient into clinic requesting that IUD strings be trimmed.  States that they are bothering/irritating her partner during sex.  Reports that she does not have trouble feeling strings and is happy with the IUD as her BCM.   Does the patient have a current or past history of drug use? No   No components found for: HCV]   Health Maintenance Due  Topic Date Due  . Hepatitis C Screening  Never done  . TETANUS/TDAP  Never done  . PAP SMEAR-Modifier  07/10/2019  . INFLUENZA VACCINE  Never done  . COVID-19 Vaccine (3 - Booster for Pfizer series) 12/04/2019    Review of Systems  All other systems reviewed and are negative.   The following portions of the patient's history were reviewed and updated as appropriate: allergies, current medications, past family history, past medical history, past social history, past surgical history and problem list. Problem list updated.   See flowsheet for other program required questions.  Objective:   Vitals:   02/29/20 0923  BP: 95/60  Weight: 142 lb (64.4 kg)  Height: 5\' 3"  (1.6 m)    Physical Exam Vitals and nursing note reviewed.  Constitutional:      General: She is not in acute distress.    Appearance: Normal appearance. She is normal weight.  HENT:     Head: Normocephalic and atraumatic.  Eyes:     Conjunctiva/sclera: Conjunctivae normal.  Pulmonary:     Effort: Pulmonary effort is normal.  Abdominal:     Palpations: Abdomen is soft. There is no mass.     Tenderness: There is no abdominal tenderness. There is no guarding or rebound.  Genitourinary:    General: Normal vulva.     Rectum: Normal.     Comments: External genitalia/pubic area without nits, lice, edema,  erythema, lesions and inguinal adenopathy. Vagina with normal mucosa and discharge. Cervix without visible lesions. IUD strings visualized and trimmed by about 5 mm. Uterus firm, mobile, nt, no masses, no CMT, no adnexal tenderness or fullness. Skin:    General: Skin is warm and dry.  Neurological:     Mental Status: She is alert and oriented to person, place, and time.  Psychiatric:        Mood and Affect: Mood normal.        Behavior: Behavior normal.        Thought Content: Thought content normal.        Judgment: Judgment normal.       Assessment and Plan:  Leah Villarreal is a 35 y.o. female presenting to the [facility] Department for a Women's Health problem visit  1. Encounter for counseling regarding contraception Reviewed with patient normal SE of IUD and when to call clinic for concerns. Enc condoms with all sex for STD protection.  2. Encounter for contraceptive management, unspecified type Counseled patient that we should not cut strings too short as this may increase her partner's discomfort and also could make it more difficult to remove if they are too short. Counseled patient that she can tuck the strings behind her cervix to decrease the chance that it would irritate her partner. RTC with any other concerns.  Return for 03/2020 for RP and prn.  No future appointments.  Matt Holmes, PA

## 2020-07-14 ENCOUNTER — Emergency Department: Payer: Worker's Compensation

## 2020-07-14 ENCOUNTER — Other Ambulatory Visit: Payer: Self-pay

## 2020-07-14 ENCOUNTER — Encounter: Payer: Self-pay | Admitting: Oncology

## 2020-07-14 ENCOUNTER — Emergency Department
Admission: EM | Admit: 2020-07-14 | Discharge: 2020-07-14 | Disposition: A | Discharge status: Home / Self Care | Payer: Worker's Compensation | Attending: Emergency Medicine | Admitting: Emergency Medicine

## 2020-07-14 DIAGNOSIS — Y990 Civilian activity done for income or pay: Secondary | ICD-10-CM | POA: Diagnosis not present

## 2020-07-14 DIAGNOSIS — Y9289 Other specified places as the place of occurrence of the external cause: Secondary | ICD-10-CM | POA: Diagnosis not present

## 2020-07-14 DIAGNOSIS — S300XXA Contusion of lower back and pelvis, initial encounter: Secondary | ICD-10-CM | POA: Diagnosis not present

## 2020-07-14 DIAGNOSIS — W010XXA Fall on same level from slipping, tripping and stumbling without subsequent striking against object, initial encounter: Secondary | ICD-10-CM | POA: Insufficient documentation

## 2020-07-14 DIAGNOSIS — S3992XA Unspecified injury of lower back, initial encounter: Secondary | ICD-10-CM | POA: Diagnosis present

## 2020-07-14 LAB — POC URINE PREG, ED: Preg Test, Ur: NEGATIVE

## 2020-07-14 MED ORDER — LIDOCAINE 5 % EX PTCH
1.0000 | MEDICATED_PATCH | CUTANEOUS | Status: DC
  Administered 2020-07-14: 1 via TRANSDERMAL
  Filled 2020-07-14: qty 1

## 2020-07-14 MED ORDER — IBUPROFEN 800 MG PO TABS: 800.0000 mg | ORAL_TABLET | Freq: Three times a day (TID) | ORAL | 0 refills | Status: AC | PRN

## 2020-07-14 MED ORDER — NAPROXEN 500 MG PO TABS
500.0000 mg | ORAL_TABLET | Freq: Once | ORAL | Status: AC
  Administered 2020-07-14: 500 mg via ORAL
  Filled 2020-07-14: qty 1

## 2020-07-14 MED ORDER — LIDOCAINE 5 % EX PTCH: 1.0000 | MEDICATED_PATCH | Freq: Two times a day (BID) | CUTANEOUS | 0 refills | Status: AC

## 2020-07-14 MED ORDER — OXYCODONE-ACETAMINOPHEN 7.5-325 MG PO TABS: 1.0000 | ORAL_TABLET | Freq: Four times a day (QID) | ORAL | 0 refills | Status: AC | PRN

## 2020-07-14 NOTE — ED Provider Notes (Signed)
[facility] Emergency Department Provider Note   ____________________________________________   Event Date/Time   First MD Initiated Contact with Patient 07/14/20 [phone]     (approximate)  I have reviewed the triage vital signs and the nursing notes.   HISTORY Via interpreter Chief Complaint Back Pain    HPI Leah Villarreal is a 35 y.o. female patient complain of coccyx pain secondary to slip and fall.  Incident occurred at work.  Patient denies bladder or bowel dysfunction.  Patient pain increased with pressure i.e. sitting.  Patient denies radicular complaint to her back pain.  Patient rates pain as 6/10.  Patient described pain as "achy".  No palliative measures prior to arrival.         No past medical history on file.  There are no problems to display for this patient.     Prior to Admission medications   Medication Sig Start Date End Date Taking? Authorizing Provider  ibuprofen (ADVIL) 800 MG tablet Take 1 tablet (800 mg total) by mouth every 8 (eight) hours as needed for moderate pain. 07/14/20  Yes Joni Reining, PA-C  lidocaine (LIDODERM) 5 % Place 1 patch onto the skin every 12 (twelve) hours. Remove & Discard patch within 12 hours or as directed by MD 07/14/20 07/14/21 Yes Joni Reining, PA-C  oxyCODONE-acetaminophen (PERCOCET) 7.5-325 MG tablet Take 1 tablet by mouth every 6 (six) hours as needed for severe pain. 07/14/20  Yes Joni Reining, PA-C    Allergies Patient has no allergy information on record.  No family history on file.  Social History   Review of Systems Constitutional: No fever/chills Eyes: No visual changes. ENT: No sore throat. Cardiovascular: Denies chest pain. Respiratory: Denies shortness of breath. Gastrointestinal: No abdominal pain.  No nausea, no vomiting.  No diarrhea.  No constipation. Genitourinary: Negative for dysuria. Musculoskeletal: Low back and coccyx pain. Skin: Negative for  rash. Neurological: Negative for headaches, focal weakness or numbness.   ____________________________________________   PHYSICAL EXAM:  VITAL SIGNS: ED Triage Vitals  Enc Vitals Group     BP 07/14/20 0407 106/69     Pulse Rate 07/14/20 0407 66     Resp 07/14/20 0407 20     Temp 07/14/20 0407 97.7 F (36.5 C)     Temp Source 07/14/20 0407 Oral     SpO2 07/14/20 0407 100 %     Weight 07/14/20 0408 153 lb (69.4 kg)     Height 07/14/20 0408 5\' 4"  (1.626 m)     Head Circumference --      Peak Flow --      Pain Score 07/14/20 0408 6     Pain Loc --      Pain Edu? --      Excl. in GC? --     Constitutional: Alert and oriented. Well appearing and in no acute distress. Eyes: Conjunctivae are normal. PERRL. EOMI. Head: Atraumatic. Nose: No congestion/rhinnorhea. Mouth/Throat: Mucous membranes are moist.  Oropharynx non-erythematous. Neck: No stridor.  No cervical spine tenderness to palpation. Hematological/Lymphatic/Immunilogical: No cervical lymphadenopathy. *ardiovascular: Normal rate, regular rhythm. Grossly normal heart sounds.  Good peripheral circulation. Respiratory: Normal respiratory effort.  No retractions. Lungs CTAB. Gastrointestinal: Soft and nontender. No distention. No abdominal bruits. No CVA tenderness. Genitourinary: Deferred **}Musculoskeletal: No lower extremity tenderness nor edema.  No joint effusions. Neurologic:  Normal speech and language. No gross focal neurologic deficits are appreciated. No gait instability. Skin:  Skin is warm, dry and intact.  No rash noted. Psychiatric: Mood and affect are normal. Speech and behavior are normal.  ____________________________________________   LABS (all labs ordered are listed, but only abnormal results are displayed)  Labs Reviewed  POC URINE PREG, ED   ____________________________________________  EKG   ____________________________________________  RADIOLOGY I, Joni Reining, personally viewed and  evaluated these images (plain radiographs) as part of my medical decision making, as well as reviewing the written report by the radiologist.  ED MD interpretation: No acute findings on lumbar spine x-ray.  Official radiology report(s): DG Lumbar Spine Complete  Result Date: 07/14/2020 CLINICAL DATA:  Fall onto back with pain EXAM: LUMBAR SPINE - COMPLETE 4+ VIEW COMPARISON:  None. FINDINGS: There is no evidence of lumbar spine fracture. Alignment is normal. Intervertebral disc spaces are maintained. IMPRESSION: Negative. Electronically Signed   By: Marnee Spring M.D.   On: 07/14/2020 08:42    ____________________________________________   PROCEDURES  Procedure(s) performed (including Critical Care):  Procedures   ____________________________________________   INITIAL IMPRESSION / ASSESSMENT AND PLAN / ED COURSE  As part of my medical decision making, I reviewed the following data within the electronic MEDICAL RECORD NUMBER         Patient presents with tailbone pain secondary to a slip and fall at work.  Discussed no acute findings on x-ray lumbar spine.  Patient complaining physical exam is consistent with coccyx contusion.  Patient given discharge care instructions and a work note.  Patient advised on effects of pain medication.  [facility]      ____________________________________________   FINAL CLINICAL IMPRESSION(S) / ED DIAGNOSES  Final diagnoses:  Coccyx contusion, initial encounter     ED Discharge Orders          Ordered    oxyCODONE-acetaminophen (PERCOCET) 7.5-325 MG tablet  Every 6 hours PRN        07/14/20 0905    ibuprofen (ADVIL) 800 MG tablet  Every 8 hours PRN        07/14/20 0905    lidocaine (LIDODERM) 5 %  Every 12 hours        07/14/20 0908             Note:  This document was prepared using Dragon voice recognition software and may include unintentional dictation errors.     Joni Reining, PA-C 07/14/20 0908    Gilles Chiquito, MD 07/16/20 [phone]

## 2020-07-14 NOTE — ED Triage Notes (Signed)
Pt states she fell at work on to her buttocks on wet floor. Pt co lower back pain.

## 2020-07-14 NOTE — ED Notes (Signed)
See triage note  Presents with lower back pain  States slipped on wet floor  Having pain to tailbone  Ambulates slowly d/t pain

## 2020-07-14 NOTE — Discharge Instructions (Addendum)
No acute findings on x-ray.  Read and follow discharge care instruction.  Be advised on drowsy effects of pain medication.  Advised purchase of a donut cushion to use when sitting.

## 2021-03-28 ENCOUNTER — Other Ambulatory Visit: Payer: Self-pay

## 2021-03-28 ENCOUNTER — Ambulatory Visit: Payer: Self-pay

## 2021-03-28 ENCOUNTER — Encounter: Payer: Self-pay | Admitting: Family Medicine

## 2021-03-28 ENCOUNTER — Ambulatory Visit (LOCAL_COMMUNITY_HEALTH_CENTER): Payer: Self-pay | Admitting: Family Medicine

## 2021-03-28 VITALS — BP 100/56 | Ht 63.0 in | Wt 133.6 lb

## 2021-03-28 DIAGNOSIS — Z1272 Encounter for screening for malignant neoplasm of vagina: Secondary | ICD-10-CM

## 2021-03-28 DIAGNOSIS — Z30431 Encounter for routine checking of intrauterine contraceptive device: Secondary | ICD-10-CM

## 2021-03-28 DIAGNOSIS — Z01419 Encounter for gynecological examination (general) (routine) without abnormal findings: Secondary | ICD-10-CM

## 2021-03-28 NOTE — Progress Notes (Signed)
Here today for yearly PE. Last PE here was 04/01/2019. Last Pap Smear was 07/09/2016 (Neg.) Has Mirena IUD for current birth control and wants to continue with that. IUD inserted here 04/08/2019. Declines STD testing. Tawny Hopping, RN

## 2021-03-31 LAB — IGP, APTIMA HPV
HPV Aptima: NEGATIVE
PAP Smear Comment: 0

## 2021-04-02 NOTE — Progress Notes (Signed)
[facility] DEPARTMENT [facility] [address] Main Number: [phone]  Family Planning Visit- Repeat Yearly Visit  Subjective:  Leah Villarreal is a 36 y.o. [phone]  being seen today for an annual wellness visit and to discuss contraception options.   The patient is currently using IUD or IUS for pregnancy prevention. Patient does not want a pregnancy in the next year. Patient has the following medical problems: has Anemia affecting pregnancy in third trimester on their problem list.  Chief Complaint  Patient presents with   Gynecologic Exam    Patient reports here for PE   Patient denies any problems or concerns.     See flowsheet for other program required questions.   Body mass index is 23.67 kg/m. - Patient is eligible for diabetes screening based on BMI and age >34?  not applicable HA1C ordered? no  Patient reports 1 of partners in last year. Desires STI screening?  No - declined    Has patient been screened once for HCV in the past?  No  No results found for: HCVAB  Does the patient have current of drug use, have a partner with drug use, and/or has been incarcerated since last result? No  If yes-- Screen for HCV through [facility]   Does the patient meet criteria for HBV testing? No  Criteria:  -Household, sexual or needle sharing contact with HBV -History of drug use -HIV positive -Those with known Hep C   Health Maintenance Due  Topic Date Due   Hepatitis C Screening  Never done   TETANUS/TDAP  Never done   COVID-19 Vaccine (3 - Booster for Pfizer series) 07/30/2019   INFLUENZA VACCINE  Never done    Review of Systems  Constitutional:  Negative for chills, fever, malaise/fatigue and weight loss.  HENT:  Negative for congestion, hearing loss and sore throat.   Eyes:  Negative for blurred vision, double vision and photophobia.  Respiratory:  Negative for shortness of breath.   Cardiovascular:  Negative  for chest pain.  Gastrointestinal:  Negative for abdominal pain, blood in stool, constipation, diarrhea, heartburn, nausea and vomiting.  Genitourinary:  Negative for dysuria and frequency.  Musculoskeletal:  Negative for back pain, joint pain and neck pain.  Skin:  Negative for itching and rash.  Neurological:  Negative for dizziness, weakness and headaches.  Endo/Heme/Allergies:  Does not bruise/bleed easily.  Psychiatric/Behavioral:  Negative for depression, substance abuse and suicidal ideas.    The following portions of the patient's history were reviewed and updated as appropriate: allergies, current medications, past family history, past medical history, past social history, past surgical history and problem list. Problem list updated.  Objective:   Vitals:   03/28/21 0959  BP: (!) 100/56  Weight: 133 lb 9.6 oz (60.6 kg)  Height: 5\' 3"  (1.6 m)    Physical Exam Vitals and nursing note reviewed.  Constitutional:      Appearance: Normal appearance.  HENT:     Head: Normocephalic and atraumatic.     Mouth/Throat:     Mouth: Mucous membranes are moist.     Dentition: Normal dentition. No dental caries.     Pharynx: No oropharyngeal exudate or posterior oropharyngeal erythema.  Eyes:     General: No scleral icterus. Neck:     Thyroid: No thyroid mass, thyromegaly or thyroid tenderness.  Cardiovascular:     Rate and Rhythm: Normal rate and regular rhythm.     Pulses: Normal pulses.  Heart sounds: Normal heart sounds.  Pulmonary:     Effort: Pulmonary effort is normal.     Breath sounds: Normal breath sounds.  Abdominal:     General: Abdomen is flat. Bowel sounds are normal.     Palpations: Abdomen is soft.  Genitourinary:    General: Normal vulva.     Rectum: Normal.     Comments: External genitalia without, lice, nits, erythema, edema , lesions or inguinal adenopathy. Vagina with normal mucosa and discharge and pH equals 4.  Cervix without visual lesions, uterus  firm, mobile, non-tender, no masses, CMT adnexal fullness or tenderness.  IUD strings present.  Musculoskeletal:        General: Normal range of motion.     Cervical back: Normal range of motion and neck supple.  Skin:    General: Skin is warm and dry.  Neurological:     General: No focal deficit present.     Mental Status: She is alert and oriented to person, place, and time.  Psychiatric:        Mood and Affect: Mood normal.        Behavior: Behavior normal.      Assessment and Plan:  Leah Villarreal is a 36 y.o. female [phone] presenting to the [facility] Department for an yearly wellness and contraception visit  Contraception counseling: Reviewed all forms of birth control options in the tiered based approach. available including abstinence; over the counter/barrier methods; hormonal contraceptive medication including pill, patch, ring, injection,contraceptive implant, ECP; hormonal and nonhormonal IUDs; permanent sterilization options including vasectomy and the various tubal sterilization modalities. Risks, benefits, and typical effectiveness rates were reviewed.  Questions were answered.  Written information was also given to the patient to review.  Patient desires to continue with IUD, this was prescribed for patient. She will follow up as needed for surveillance.   She was told to call with any further questions, or with any concerns about this method of contraception.  Emphasized use of condoms 100% of the time for STI prevention.  Patient was not offered ECP.   1. Smear, vaginal, as part of routine gynecological examination Well woman exam  CBE today  Pap today   - IGP, Aptima HPV  2. Encounter for routine checking of intrauterine contraceptive device (IUD) IUD in places strings visible  Pt denies any problems with IUD.    No follow-ups on file.  No future appointments.  Wendi Snipes, FNP

## 2022-08-22 ENCOUNTER — Ambulatory Visit (LOCAL_COMMUNITY_HEALTH_CENTER): Payer: Self-pay | Admitting: Family Medicine

## 2022-08-22 ENCOUNTER — Encounter: Payer: Self-pay | Admitting: Family Medicine

## 2022-08-22 VITALS — BP 98/60 | Ht 63.0 in | Wt 141.6 lb

## 2022-08-22 DIAGNOSIS — Z3009 Encounter for other general counseling and advice on contraception: Secondary | ICD-10-CM

## 2022-08-22 DIAGNOSIS — Z0000 Encounter for general adult medical examination without abnormal findings: Secondary | ICD-10-CM

## 2022-08-22 NOTE — Progress Notes (Signed)
Pt is here for PE.  FP packet and condoms given.  Berdie Ogren, RN

## 2022-08-22 NOTE — Progress Notes (Signed)
[facility] DEPARTMENT [facility] [address] Main Number: [phone]  Family Planning Visit- Repeat Yearly Visit  Subjective:  Natisha Trzcinski is a 37 y.o. [phone]  being seen today for an annual wellness visit and to discuss contraception options.   The patient is currently using IUD or IUS for pregnancy prevention. Patient does not want a pregnancy in the next year.    report they are looking for a method that provides High efficacy at preventing pregnancy   Patient has the following medical problems: has Anemia on their problem list.  Chief Complaint  Patient presents with   Annual Exam    Patient reports to clinic for annual exam  Patient denies concerns about self   See flowsheet for other program required questions.   Body mass index is 25.08 kg/m. - Patient is eligible for diabetes screening based on BMI> 25 and age >35?  yes HA1C ordered? No- practitioner oversight  Patient reports 1 of partners in last year. Desires STI screening?  No - declined   Has patient been screened once for HCV in the past?  No  No results found for: "HCVAB"  Does the patient have current of drug use, have a partner with drug use, and/or has been incarcerated since last result? No  If yes-- Screen for HCV through [facility]   Does the patient meet criteria for HBV testing? No  Criteria:  -Household, sexual or needle sharing contact with HBV -History of drug use -HIV positive -Those with known Hep C   Health Maintenance Due  Topic Date Due   Hepatitis C Screening  Never done   DTaP/Tdap/Td (1 - Tdap) Never done   COVID-19 Vaccine (3 - 2023-24 season) 10/05/2021    Review of Systems  Constitutional:  Negative for weight loss.  Eyes:  Negative for blurred vision.  Respiratory:  Negative for cough and shortness of breath.   Cardiovascular:  Negative for claudication.  Gastrointestinal:  Negative for nausea.  Genitourinary:   Negative for dysuria and frequency.  Skin:  Negative for rash.  Neurological:  Negative for headaches.  Endo/Heme/Allergies:  Does not bruise/bleed easily.    The following portions of the patient's history were reviewed and updated as appropriate: allergies, current medications, past family history, past medical history, past social history, past surgical history and problem list. Problem list updated.  Objective:   Vitals:   08/22/22 0843  BP: 98/60  Weight: 141 lb 9.6 oz (64.2 kg)  Height: 5\' 3"  (1.6 m)    Physical Exam Constitutional:      Appearance: Normal appearance.  HENT:     Head: Normocephalic and atraumatic.  Pulmonary:     Effort: Pulmonary effort is normal.  Chest:  Breasts:    Tanner Score is 5.     Right: Normal. No mass, nipple discharge, skin change or tenderness.     Left: Normal. No mass, nipple discharge, skin change or tenderness.  Abdominal:     Palpations: Abdomen is soft.  Musculoskeletal:        General: Normal range of motion.  Lymphadenopathy:     Upper Body:     Right upper body: No axillary adenopathy.     Left upper body: No axillary adenopathy.  Skin:    General: Skin is warm and dry.  Neurological:     General: No focal deficit present.     Mental Status: She is alert.  Psychiatric:  Mood and Affect: Mood normal.        Behavior: Behavior normal.       Assessment and Plan:  Deborh Pense is a 37 y.o. female [phone] presenting to the [facility] Department for an yearly wellness and contraception visit   Contraception counseling: Reviewed options based on patient desire and reproductive life plan. Patient is interested in IUD or IUS. This was not provided to the patient today. IUD already in place  Risks, benefits, and typical effectiveness rates were reviewed.  Questions were answered.  Written information was also given to the patient to review.    The patient will follow up in  1 years for  surveillance.  The patient was told to call with any further questions, or with any concerns about this method of contraception.  Emphasized use of condoms 100% of the time for STI prevention.  Educated on ECP and assessed need for ECP. Not indicated- IUD in place   1. Well woman exam (no gynecological exam) -IUD in place- good until 04/2027 -CBE today (normal)- next due in 3 years -last pap in 2023, NILM and HPV negative, repeat due in 5 years -no concerns about self    Return in about 1 year (around 08/22/2023) for annual well-woman exam.  No future appointments.  Lenice Llamas, [place]

## 2023-12-02 ENCOUNTER — Ambulatory Visit: Payer: Self-pay
